# Patient Record
Sex: Male | Born: 1985 | Race: White | Hispanic: No | Marital: Married | Smoking: Current every day smoker
Health system: Southern US, Community
[De-identification: ages and names within clinical notes are randomized; demographics above are authoritative.]

## PROBLEM LIST (undated history)

## (undated) HISTORY — PX: NO PAST SURGERIES: SHX2092

---
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
## 2006-12-05 ENCOUNTER — Emergency Department (HOSPITAL_COMMUNITY): Admission: EM | Admit: 2006-12-05 | Discharge: 2006-12-06 | Payer: Self-pay | Admitting: Emergency Medicine

## 2011-10-10 ENCOUNTER — Emergency Department (HOSPITAL_COMMUNITY)
Admission: EM | Admit: 2011-10-10 | Discharge: 2011-10-10 | Disposition: A | Discharge status: Home / Self Care | Payer: Self-pay | Attending: Emergency Medicine | Admitting: Emergency Medicine

## 2011-10-10 ENCOUNTER — Encounter (HOSPITAL_COMMUNITY): Payer: Self-pay | Admitting: *Deleted

## 2011-10-10 DIAGNOSIS — T07XXXA Unspecified multiple injuries, initial encounter: Secondary | ICD-10-CM

## 2011-10-10 DIAGNOSIS — Z23 Encounter for immunization: Secondary | ICD-10-CM | POA: Insufficient documentation

## 2011-10-10 DIAGNOSIS — IMO0002 Reserved for concepts with insufficient information to code with codable children: Secondary | ICD-10-CM | POA: Insufficient documentation

## 2011-10-10 DIAGNOSIS — F17200 Nicotine dependence, unspecified, uncomplicated: Secondary | ICD-10-CM | POA: Insufficient documentation

## 2011-10-10 DIAGNOSIS — Y92410 Unspecified street and highway as the place of occurrence of the external cause: Secondary | ICD-10-CM | POA: Insufficient documentation

## 2011-10-10 MED ORDER — OXYCODONE-ACETAMINOPHEN 5-325 MG PO TABS
1.0000 | ORAL_TABLET | Freq: Once | ORAL | Status: AC
  Administered 2011-10-10: 1 via ORAL
  Filled 2011-10-10: qty 1

## 2011-10-10 MED ORDER — HYDROCODONE-ACETAMINOPHEN 5-325 MG PO TABS: ORAL_TABLET | ORAL | Status: AC

## 2011-10-10 MED ORDER — IBUPROFEN 800 MG PO TABS: 800.0000 mg | ORAL_TABLET | Freq: Three times a day (TID) | ORAL | Status: AC

## 2011-10-10 MED ORDER — TETANUS-DIPHTH-ACELL PERTUSSIS 5-2.5-18.5 LF-MCG/0.5 IM SUSP
0.5000 mL | Freq: Once | INTRAMUSCULAR | Status: AC
  Administered 2011-10-10: 0.5 mL via INTRAMUSCULAR
  Filled 2011-10-10: qty 0.5

## 2011-10-10 MED ORDER — BACITRACIN 500 UNIT/GM EX OINT
1.0000 "application " | TOPICAL_OINTMENT | Freq: Two times a day (BID) | CUTANEOUS | Status: DC
  Administered 2011-10-10: 1 via TOPICAL
  Filled 2011-10-10 (×2): qty 0.9
  Filled 2011-10-10: qty 3.6
  Filled 2011-10-10 (×2): qty 0.9
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
## 2011-10-10 NOTE — Discharge Instructions (Signed)
Abrasions Abrasions are skin scrapes. Their treatment depends on how large and deep the abrasion is. Abrasions do not extend through all layers of the skin. A cut or lesion through all skin layers is called a laceration. HOME CARE INSTRUCTIONS   If you were given a dressing, change it at least once a day or as instructed by your caregiver. If the bandage sticks, soak it off with a solution of water or hydrogen peroxide.   Twice a day, wash the area with soap and water to remove all the cream/ointment. You may do this in a sink, under a tub faucet, or in a shower. Rinse off the soap and pat dry with a clean towel. Look for signs of infection (see below).   Reapply cream/ointment according to your caregiver's instruction. This will help prevent infection and keep the bandage from sticking. Telfa or gauze over the wound and under the dressing or wrap will also help keep the bandage from sticking.   If the bandage becomes wet, dirty, or develops a foul smell, change it as soon as possible.   Only take over-the-counter or prescription medicines for pain, discomfort, or fever as directed by your caregiver.  SEEK IMMEDIATE MEDICAL CARE IF:   Increasing pain in the wound.   Signs of infection develop: redness, swelling, surrounding area is tender to touch, or pus coming from the wound.   You have a fever.   Any foul smell coming from the wound or dressing.  Most skin wounds heal within ten days. Facial wounds heal faster. However, an infection may occur despite proper treatment. You should have the wound checked for signs of infection within 24 to 48 hours or sooner if problems arise. If you were not given a wound-check appointment, look closely at the wound yourself on the second day for early signs of infection listed above. MAKE SURE YOU:   Understand these instructions.   Will watch your condition.   Will get help right away if you are not doing well or get worse.  Document Released:  02/03/2005 Document Revised: 04/15/2011 Document Reviewed: 03/30/2011 [employer] Patient Information 2012 [employer]. 
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
## 2011-10-18 NOTE — ED Provider Notes (Signed)
Medical screening examination/treatment/procedure(s) were performed by non-physician practitioner and as supervising physician I was immediately available for consultation/collaboration.  Mikia Delaluz R. Kupono Marling, MD 10/18/11 2148 

## 2011-12-30 ENCOUNTER — Encounter (HOSPITAL_COMMUNITY): Payer: Self-pay | Admitting: *Deleted

## 2011-12-30 ENCOUNTER — Emergency Department (HOSPITAL_COMMUNITY)
Admission: EM | Admit: 2011-12-30 | Discharge: 2011-12-30 | Disposition: A | Discharge status: Home / Self Care | Payer: Self-pay | Attending: Emergency Medicine | Admitting: Emergency Medicine

## 2011-12-30 DIAGNOSIS — F17200 Nicotine dependence, unspecified, uncomplicated: Secondary | ICD-10-CM | POA: Insufficient documentation

## 2011-12-30 DIAGNOSIS — T63481A Toxic effect of venom of other arthropod, accidental (unintentional), initial encounter: Secondary | ICD-10-CM | POA: Insufficient documentation

## 2011-12-30 DIAGNOSIS — T6391XA Toxic effect of contact with unspecified venomous animal, accidental (unintentional), initial encounter: Secondary | ICD-10-CM | POA: Insufficient documentation

## 2011-12-30 MED ORDER — PREDNISONE 20 MG PO TABS: ORAL_TABLET | ORAL | Status: DC

## 2011-12-30 MED ORDER — PREDNISONE 20 MG PO TABS
60.0000 mg | ORAL_TABLET | Freq: Once | ORAL | Status: AC
  Administered 2011-12-30: 60 mg via ORAL
  Filled 2011-12-30: qty 3

## 2011-12-30 MED ORDER — FAMOTIDINE 20 MG PO TABS
20.0000 mg | ORAL_TABLET | Freq: Once | ORAL | Status: AC
  Administered 2011-12-30: 20 mg via ORAL
  Filled 2011-12-30: qty 1

## 2011-12-30 MED ORDER — FAMOTIDINE 20 MG PO TABS: 20.0000 mg | ORAL_TABLET | Freq: Two times a day (BID) | ORAL | Status: DC

## 2011-12-30 MED ORDER — DIPHENHYDRAMINE HCL 25 MG PO CAPS
50.0000 mg | ORAL_CAPSULE | Freq: Once | ORAL | Status: AC
  Administered 2011-12-30: 50 mg via ORAL
  Filled 2011-12-30: qty 2

## 2011-12-30 MED ORDER — EPINEPHRINE 0.3 MG/0.3ML IJ DEVI: 0.3000 mg | INTRAMUSCULAR | Status: DC | PRN

## 2011-12-30 NOTE — ED Provider Notes (Signed)
History    This chart was scribed for Christian Human, MD, MD by Smitty Pluck. The patient was seen in room APA14 and the patient's care was started at 9:24AM.   CSN: [id number]  Arrival date & time 12/30/11  7829   First MD Initiated Contact with Patient 12/30/11 [phone]      Chief Complaint  Patient presents with  . Insect Bite    (Consider location/radiation/quality/duration/timing/severity/associated sxs/prior treatment) The history is provided by the patient.   Christian Costa is a 26 y.o. male who presents to the Emergency Department complaining of constant, moderate right facial swelling radiating down to right side of neck due to being stung by yellow jacket 1 day ago. Pt reports that the yellow jacket stung him over his eyebrow. 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 Pt reports taking Benadryl without relief. He reports having swelling before due to bee sting but the swelling was not as bad as current condition. Denies difficulty breathing or swallowing. Pt reports that he smokes 0.5/day.   History reviewed. No pertinent past medical history.  History reviewed. No pertinent past surgical history.  History reviewed. No pertinent family history.  History  Substance Use Topics  . Smoking status: Current Everyday Smoker    Types: Cigarettes  . Smokeless tobacco: Not on file  . Alcohol Use: No      Review of Systems  All other systems reviewed and are negative.  10 Systems reviewed and all are negative for acute change except as noted in the HPI.    Allergies  Review of patient's allergies indicates no known allergies.  Home Medications  No current outpatient prescriptions on file.  BP 125/72  Pulse 84  Temp 98.5 F (36.9 C) (Oral)  Resp 16  Ht 6\' 2"  (1.88 m)  Wt 225 lb (102.059 kg)  BMI 28.89 kg/m2  SpO2 100%  Physical Exam  Nursing note and vitals reviewed. Constitutional: He is oriented to person, place, and time. He appears  well-developed and well-nourished. No distress.  HENT:       Swelling that is puffy of upper and lower eyelid, right cheek and right neck below jaw  No swelling of oropharynx   Eyes: Conjunctivae are normal.  Neck: Normal range of motion. Neck supple.  Pulmonary/Chest: Effort normal and breath sounds normal. No respiratory distress.  Neurological: He is alert and oriented to person, place, and time.  Skin: Skin is warm and dry.       No hives present   Psychiatric: He has a normal mood and affect. His behavior is normal.    ED Course  Procedures (including critical care time) DIAGNOSTIC STUDIES: Oxygen Saturation is 100% on room air, normal by my interpretation.    COORDINATION OF CARE: 9:38AM Ordered:   Medications  predniSONE (DELTASONE) tablet 60 mg (not administered)  diphenhydrAMINE (BENADRYL) capsule 50 mg (not administered)  famotidine (PEPCID) tablet 20 mg (not administered)     DISP: Benadryl 50 mg q6h prn allergic swelling. Pepcid 20 mg bid. Prednisone taper.' EpiPen Rx.     1. Allergic reaction to insect sting     I personally performed the services described in this documentation, which was scribed in my presence. The recorded information has been reviewed and considered.    Medical screening examination/treatment/procedure(s) were conducted as a shared visit with non-physician practitioner(s) and myself.  I personally evaluated the patient during the encounter     Carleene Cooper III, MD 12/30/11 [phone]

## 2011-12-30 NOTE — ED Notes (Signed)
Pt c/o being stung by yellow jacket yesterday on right eyebrow area, pt has facial swelling to right eye that extends dow the side of right face area, pt denies any sob,

## 2011-12-30 NOTE — ED Notes (Signed)
Pt was stung by a yellow jacket on his eyebrow. Right side of face very swollen. Has been taking Benadryl but has not helped. Denies difficulty swallowing or breathing. States that his right side of his neck is started to swell and it hurts to swallow.

## 2012-10-21 ENCOUNTER — Encounter (HOSPITAL_COMMUNITY): Payer: Self-pay | Admitting: *Deleted

## 2012-10-21 ENCOUNTER — Emergency Department (HOSPITAL_COMMUNITY)
Admission: EM | Admit: 2012-10-21 | Discharge: 2012-10-21 | Disposition: A | Discharge status: Home / Self Care | Payer: Self-pay | Attending: Emergency Medicine | Admitting: Emergency Medicine

## 2012-10-21 DIAGNOSIS — R110 Nausea: Secondary | ICD-10-CM | POA: Insufficient documentation

## 2012-10-21 DIAGNOSIS — K0889 Other specified disorders of teeth and supporting structures: Secondary | ICD-10-CM

## 2012-10-21 DIAGNOSIS — K089 Disorder of teeth and supporting structures, unspecified: Secondary | ICD-10-CM | POA: Insufficient documentation

## 2012-10-21 DIAGNOSIS — R51 Headache: Secondary | ICD-10-CM | POA: Insufficient documentation

## 2012-10-21 DIAGNOSIS — F17200 Nicotine dependence, unspecified, uncomplicated: Secondary | ICD-10-CM | POA: Insufficient documentation

## 2012-10-21 MED ORDER — PENICILLIN V POTASSIUM 500 MG PO TABS: 500.0000 mg | ORAL_TABLET | Freq: Four times a day (QID) | ORAL | Status: AC

## 2012-10-21 MED ORDER — OXYCODONE-ACETAMINOPHEN 5-325 MG PO TABS
1.0000 | ORAL_TABLET | Freq: Once | ORAL | Status: AC
  Administered 2012-10-21: 1 via ORAL
  Filled 2012-10-21: qty 1

## 2012-10-21 NOTE — ED Notes (Addendum)
Pt c/o pain to left and right wisdom teeth on the top of his mouth. Pt reports 1 episode of vomiting yesterday.

## 2012-10-21 NOTE — ED Provider Notes (Signed)
History  This chart was scribed for Joya Gaskins, MD by Greggory Stallion, ED Scribe. This patient was seen in room APA03/AP[facility]03 and the patient's care was started at 9:12 AM.  CSN: [id number]  Arrival date & time 10/21/12  0909    No chief complaint on file.  Patient is a 27 y.o. male presenting with tooth pain. The history is provided by the patient. No language interpreter was used.  Dental Pain Location:  Upper Quality:  Aching Severity:  Severe Onset quality:  Gradual Duration:  2 months Timing:  Constant Progression:  Worsening Chronicity:  New Associated symptoms: headaches   Associated symptoms: no fever      PMH - none  No past surgical history on file.  No family history on file.  History  Substance Use Topics  . Smoking status: Current Every Day Smoker    Types: Cigarettes  . Smokeless tobacco: Not on file  . Alcohol Use: No      Review of Systems  Constitutional: Negative for fever.  HENT: Positive for dental problem.   Gastrointestinal: Positive for vomiting.  Neurological: Positive for headaches.    Allergies  Review of patient's allergies indicates no known allergies.  Home Medications   Current Outpatient Rx  Name  Route  Sig  Dispense  Refill  . acetaminophen (TYLENOL) 325 MG tablet   Oral   Take 650 mg by mouth every 6 (six) hours as needed for pain.         Marland Kitchen EPINEPHrine (EPIPEN) 0.3 mg/0.3 mL DEVI   Intramuscular   Inject 0.3 mLs (0.3 mg total) into the muscle as needed (Inject self if bitten by insect, then go to hospital ED immediately.).   1 Device   1     BP 144/94  Pulse 66  Resp 20  SpO2 100%  Physical Exam  CONSTITUTIONAL: Well developed/well nourished HEAD AND FACE: Normocephalic/atraumatic EYES: EOMI/PERRL ENMT: Mucous membranes moist.  Poor dentition.  No trismus.  No focal abscess noted. NECK: supple no meningeal signs CV: S1/S2 noted, no murmurs/rubs/gallops noted LUNGS: Lungs are clear to auscultation  bilaterally, no apparent distress ABDOMEN: soft, nontender, no rebound or guarding NEURO: Pt is awake/alert, moves all extremitiesx4 EXTREMITIES:full ROM SKIN: warm, color normal  ED Course  Procedures   DIAGNOSTIC STUDIES: Oxygen Saturation is 100% on RA, normal by my interpretation.    COORDINATION OF CARE: 9:20 AM-Discussed treatment plan with pt at bedside and pt agreed to plan.     MDM  Nursing notes including past medical history and social history reviewed and considered in documentation       I personally performed the services described in this documentation, which was scribed in my presence. The recorded information has been reviewed and is accurate.     Joya Gaskins, MD 10/21/12 1620

## 2012-10-25 ENCOUNTER — Encounter (HOSPITAL_COMMUNITY): Payer: Self-pay | Admitting: *Deleted

## 2012-10-25 ENCOUNTER — Emergency Department (HOSPITAL_COMMUNITY)
Admission: EM | Admit: 2012-10-25 | Discharge: 2012-10-25 | Disposition: A | Discharge status: Home / Self Care | Payer: Self-pay | Attending: Emergency Medicine | Admitting: Emergency Medicine

## 2012-10-25 DIAGNOSIS — K0889 Other specified disorders of teeth and supporting structures: Secondary | ICD-10-CM

## 2012-10-25 DIAGNOSIS — F17200 Nicotine dependence, unspecified, uncomplicated: Secondary | ICD-10-CM | POA: Insufficient documentation

## 2012-10-25 DIAGNOSIS — K089 Disorder of teeth and supporting structures, unspecified: Secondary | ICD-10-CM | POA: Insufficient documentation

## 2012-10-25 DIAGNOSIS — K029 Dental caries, unspecified: Secondary | ICD-10-CM | POA: Insufficient documentation

## 2012-10-25 MED ORDER — HYDROCODONE-ACETAMINOPHEN 5-325 MG PO TABS: ORAL_TABLET | ORAL | Status: DC

## 2012-10-25 MED ORDER — CLINDAMYCIN HCL 150 MG PO CAPS: ORAL_CAPSULE | ORAL | Status: DC

## 2012-10-25 MED ORDER — NAPROXEN 250 MG PO TABS: 250.0000 mg | ORAL_TABLET | Freq: Two times a day (BID) | ORAL | Status: DC

## 2012-10-25 NOTE — ED Provider Notes (Signed)
History     CSN: [id number]  Arrival date & time 10/25/12  7829   First MD Initiated Contact with Patient 10/25/12 [phone]      Chief Complaint  Patient presents with  . Dental Pain     HPI Pt was seen at 0850.  Per pt, c/o gradual onset and persistence of constant right upper teeth "pain" for the past 2 months, worse over the past several weeks.  Denies fevers, no intra-oral edema, no rash, no facial swelling, no dysphagia, no neck pain.   The condition is aggravated by nothing. The condition is relieved by nothing. The symptoms have been associated with no other complaints. The patient has no significant history of serious medical conditions.     History reviewed. No pertinent past medical history.  History reviewed. No pertinent past surgical history.   History  Substance Use Topics  . Smoking status: Current Every Day Smoker    Types: Cigarettes  . Smokeless tobacco: Not on file  . Alcohol Use: Yes      Review of Systems ROS: Statement: All systems negative except as marked or noted in the HPI; Constitutional: Negative for fever and chills. ; ; Eyes: Negative for eye pain and discharge. ; ; ENMT: Positive for dental caries, dental hygiene poor and toothache. Negative for ear pain, bleeding gums, dental injury, facial deformity, facial swelling, hoarseness, nasal congestion, sinus pressure, sore throat, throat swelling and tongue swollen. ; ; Cardiovascular: Negative for chest pain, palpitations, diaphoresis, dyspnea and peripheral edema. ; ; Respiratory: Negative for cough, wheezing and stridor. ; ; Gastrointestinal: Negative for nausea, vomiting, diarrhea and abdominal pain. ; ; Genitourinary: Negative for dysuria, flank pain and hematuria. ; ; Musculoskeletal: Negative for back pain and neck pain. ; ; Skin: Negative for rash and skin lesion. ; ; Neuro: Negative for headache, lightheadedness and neck stiffness. ;      Allergies  Other  Home Medications   Current  Outpatient Rx  Name  Route  Sig  Dispense  Refill  . clindamycin (CLEOCIN) 150 MG capsule      3 tabs PO TID x 10 days   90 capsule   0   . EPINEPHrine (EPIPEN) 0.3 mg/0.3 mL DEVI   Intramuscular   Inject 0.3 mLs (0.3 mg total) into the muscle as needed (Inject self if bitten by insect, then go to hospital ED immediately.).   1 Device   1   . HYDROcodone-acetaminophen (NORCO/VICODIN) 5-325 MG per tablet      1 or 2 tabs PO q6 hours prn pain   14 tablet   0   . naproxen (NAPROSYN) 250 MG tablet   Oral   Take 1 tablet (250 mg total) by mouth 2 (two) times daily with a meal.   14 tablet   0   . penicillin v potassium (VEETID) 500 MG tablet   Oral   Take 1 tablet (500 mg total) by mouth 4 (four) times daily.   40 tablet   0     BP 148/87  Pulse 73  Resp 19  Ht 6\' 2"  (1.88 m)  Wt 220 lb (99.791 kg)  BMI 28.23 kg/m2  SpO2 94%  Physical Exam 0855: Physical examination: Vital signs and O2 SAT: Reviewed; Constitutional: Well developed, Well nourished, Well hydrated, In no acute distress; Head and Face: Normocephalic, Atraumatic; Eyes: EOMI, PERRL, No scleral icterus; ENMT: Mouth and pharynx normal, Poor dentition, Widespread dental decay, Left TM normal, Right TM normal, Mucous membranes moist, +  upper right 1st molar and 2nd premolar with extensive dental decay.  No gingival erythema, edema, fluctuance, or drainage.  No intra-oral edema. No submandibular or sublingual edema. No hoarse voice, no drooling, no stridor.  ; Neck: Supple, Full range of motion, No lymphadenopathy; Cardiovascular: Regular rate and rhythm, No murmur, rub, or gallop; Respiratory: Breath sounds clear & equal bilaterally, No rales, rhonchi, wheezes, Normal respiratory effort/excursion; Chest: Nontender, Movement normal; Extremities: Pulses normal, No tenderness, No edema; Neuro: AA&Ox3, Major CN grossly intact.  No gross focal motor or sensory deficits in extremities.; Skin: Color normal, No rash, No  petechiae, Warm, Dry   ED Course  Procedures     MDM  MDM Reviewed: previous chart, nursing note and vitals     0855:  Pt encouraged to f/u with dentist or oral surgeon for his dental needs for good continuity of care and definitive treatment.  Verb understanding.         Laray Anger, DO 10/25/12 2123

## 2012-10-25 NOTE — ED Notes (Signed)
Dental pain. 

## 2013-05-19 ENCOUNTER — Emergency Department (HOSPITAL_COMMUNITY)
Admission: EM | Admit: 2013-05-19 | Discharge: 2013-05-19 | Disposition: A | Discharge status: Home / Self Care | Payer: Self-pay | Attending: Emergency Medicine | Admitting: Emergency Medicine

## 2013-05-19 ENCOUNTER — Encounter (HOSPITAL_COMMUNITY): Payer: Self-pay | Admitting: Emergency Medicine

## 2013-05-19 DIAGNOSIS — F17200 Nicotine dependence, unspecified, uncomplicated: Secondary | ICD-10-CM | POA: Insufficient documentation

## 2013-05-19 DIAGNOSIS — K089 Disorder of teeth and supporting structures, unspecified: Secondary | ICD-10-CM | POA: Insufficient documentation

## 2013-05-19 DIAGNOSIS — K0889 Other specified disorders of teeth and supporting structures: Secondary | ICD-10-CM

## 2013-05-19 DIAGNOSIS — Z792 Long term (current) use of antibiotics: Secondary | ICD-10-CM | POA: Insufficient documentation

## 2013-05-19 DIAGNOSIS — K029 Dental caries, unspecified: Secondary | ICD-10-CM | POA: Insufficient documentation

## 2013-05-19 MED ORDER — CLINDAMYCIN HCL 150 MG PO CAPS
150.0000 mg | ORAL_CAPSULE | Freq: Once | ORAL | Status: AC
  Administered 2013-05-19: 150 mg via ORAL
  Filled 2013-05-19: qty 1

## 2013-05-19 MED ORDER — IBUPROFEN 800 MG PO TABS: 800.0000 mg | ORAL_TABLET | Freq: Three times a day (TID) | ORAL | Status: DC

## 2013-05-19 MED ORDER — CLINDAMYCIN HCL 150 MG PO CAPS: 150.0000 mg | ORAL_CAPSULE | Freq: Four times a day (QID) | ORAL | Status: DC

## 2013-05-19 NOTE — Discharge Instructions (Signed)
Dental Care and Dentist Visits Dental care supports good overall health. Regular dental visits can also help you avoid dental pain, bleeding, infection, and other more serious health problems in the future. It is important to keep the mouth healthy because diseases in the teeth, gums, and other oral tissues can spread to other areas of the body. Some problems, such as diabetes, heart disease, and pre-term labor have been associated with poor oral health.  See your dentist every 6 months. If you experience emergency problems such as a toothache or broken tooth, go to the dentist right away. If you see your dentist regularly, you may catch problems early. It is easier to be treated for problems in the early stages.  WHAT TO EXPECT AT A DENTIST VISIT  Your dentist will look for many common oral health problems and recommend proper treatment. At your regular dental visit, you can expect:  Gentle cleaning of the teeth and gums. This includes scraping and polishing. This helps to remove the sticky substance around the teeth and gums (plaque). Plaque forms in the mouth shortly after eating. Over time, plaque hardens on the teeth as tartar. If tartar is not removed regularly, it can cause problems. Cleaning also helps remove stains.  Periodic X-rays. These pictures of the teeth and supporting bone will help your dentist assess the health of your teeth.  Periodic fluoride treatments. Fluoride is a natural mineral shown to help strengthen teeth. Fluoride treatmentinvolves applying a fluoride gel or varnish to the teeth. It is most commonly done in children.  Examination of the mouth, tongue, jaws, teeth, and gums to look for any oral health problems, such as:  Cavities (dental caries). This is decay on the tooth caused by plaque, sugar, and acid in the mouth. It is best to catch a cavity when it is small.  Inflammation of the gums caused by plaque buildup (gingivitis).  Problems with the mouth or malformed  or misaligned teeth.  Oral cancer or other diseases of the soft tissues or jaws. KEEP YOUR TEETH AND GUMS HEALTHY For healthy teeth and gums, follow these general guidelines as well as your dentist's specific advice:  Have your teeth professionally cleaned at the dentist every 6 months.  Brush twice daily with a fluoride toothpaste.  Floss your teeth daily.  Ask your dentist if you need fluoride supplements, treatments, or fluoride toothpaste.  Eat a healthy diet. Reduce foods and drinks with added sugar.  Avoid smoking. TREATMENT FOR ORAL HEALTH PROBLEMS If you have oral health problems, treatment varies depending on the conditions present in your teeth and gums.  Your caregiver will most likely recommend good oral hygiene at each visit.  For cavities, gingivitis, or other oral health disease, your caregiver will perform a procedure to treat the problem. This is typically done at a separate appointment. Sometimes your caregiver will refer you to another dental specialist for specific tooth problems or for surgery. SEEK IMMEDIATE DENTAL CARE IF:  You have pain, bleeding, or soreness in the gum, tooth, jaw, or mouth area.  A permanent tooth becomes loose or separated from the gum socket.  You experience a blow or injury to the mouth or jaw area. Document Released: 01/06/2011 Document Revised: 07/19/2011 Document Reviewed: 01/06/2011 [employer] Patient Information 2014 [employer].  

## 2013-05-19 NOTE — ED Provider Notes (Signed)
CSN: [id number]     Arrival date & time 05/19/13  1110 History   First MD Initiated Contact with Patient 05/19/13 1234     Chief Complaint  Patient presents with  . Dental Pain   (Consider location/radiation/quality/duration/timing/severity/associated sxs/prior Treatment) HPI Comments: Patient is otherwise healthy 28 year old male who presents with continued left upper 2nd molar dental pain - he states that he has been unable to see a dentist because of money issues and having no insurance, he states that he thinks they may be infected again.  He denies fever, chills, inability to open his mouth, nausea, vomiting or diarrhea.  Patient is a 28 y.o. male presenting with tooth pain. The history is provided by the patient. No language interpreter was used.  Dental Pain Location:  Upper Upper teeth location:  15/LU 2nd molar Quality:  Dull and localized Severity:  Moderate Onset quality:  Gradual Duration:  4 days Timing:  Constant Progression:  Worsening Chronicity:  Recurrent Context: dental caries and poor dentition   Context: not abscess, not dental fracture, filling still in place, not intrusion and not malocclusion   Relieved by:  Nothing Worsened by:  Nothing tried Ineffective treatments:  None tried Associated symptoms: gum swelling   Associated symptoms: no congestion, no difficulty swallowing, no drooling, no facial pain, no facial swelling, no fever, no neck pain, no neck swelling, no oral bleeding, no oral lesions and no trismus   Risk factors: periodontal disease     History reviewed. No pertinent past medical history. History reviewed. No pertinent past surgical history. No family history on file. History  Substance Use Topics  . Smoking status: Current Every Day Smoker    Types: Cigarettes  . Smokeless tobacco: Not on file  . Alcohol Use: Yes    Review of Systems  Constitutional: Negative for fever.  HENT: Negative for congestion, drooling, facial swelling and  mouth sores.   Musculoskeletal: Negative for neck pain.  All other systems reviewed and are negative.    Allergies  Other  Home Medications   Current Outpatient Rx  Name  Route  Sig  Dispense  Refill  . ibuprofen (ADVIL,MOTRIN) 200 MG tablet   Oral   Take 1,000 mg by mouth 2 (two) times daily as needed for moderate pain.         Marland Kitchen penicillin v potassium (VEETID) 500 MG tablet   Oral   Take 500 mg by mouth 3 (three) times daily.         . clindamycin (CLEOCIN) 150 MG capsule   Oral   Take 1 capsule (150 mg total) by mouth every 6 (six) hours.   28 capsule   0   . ibuprofen (ADVIL,MOTRIN) 800 MG tablet   Oral   Take 1 tablet (800 mg total) by mouth 3 (three) times daily.   21 tablet   0    BP 148/95  Pulse 57  Temp(Src) 98.7 F (37.1 C) (Axillary)  SpO2 100% Physical Exam  Nursing note and vitals reviewed. Constitutional: He is oriented to person, place, and time. He appears well-developed and well-nourished. No distress.  HENT:  Head: Normocephalic and atraumatic.  Right Ear: External ear normal.  Left Ear: External ear normal.  Nose: Nose normal.  Mouth/Throat: Oropharynx is clear and moist. No trismus in the jaw. Dental caries present. No dental abscesses. No oropharyngeal exudate.    Eyes: Conjunctivae are normal. Pupils are equal, round, and reactive to light.  Neck: Normal range of motion.  Neck supple.  Pulmonary/Chest: Effort normal.  Musculoskeletal: Normal range of motion. He exhibits no edema and no tenderness.  Lymphadenopathy:    He has no cervical adenopathy.  Neurological: He is alert and oriented to person, place, and time. He exhibits normal muscle tone. Coordination normal.  Skin: Skin is warm and dry. No rash noted. No erythema. No pallor.  Psychiatric: He has a normal mood and affect. His behavior is normal. Thought content normal.    ED Course  Procedures (including critical care time) Labs Review Labs Reviewed - No data to  display Imaging Review No results found.  EKG Interpretation   None       MDM   1. Pain, dental    Patient with recurrent dental pain - multiple caries noted, no clinical suspicion for Lud[place]igs angina, PTA, parotitis, mastoiditis.    Izola PriceFrances C. Marisue HumbleSanford, PA-C 05/19/13 1304

## 2013-05-19 NOTE — ED Provider Notes (Signed)
Medical screening examination/treatment/procedure(s) were performed by non-physician practitioner and as supervising physician I was immediately available for consultation/collaboration.  EKG Interpretation   None         Christian Parilla L Jaymir Struble, MD 05/19/13 1607 

## 2013-05-19 NOTE — ED Notes (Signed)
Patient with no complaints at this time. Respirations even and unlabored. Skin warm/dry. Discharge instructions reviewed with patient at this time. Patient given opportunity to voice concerns/ask questions. Patient discharged at this time and left Emergency Department with steady gait.   

## 2013-05-19 NOTE — ED Notes (Signed)
Pt c/o toothache x 4 days.   

## 2014-01-30 ENCOUNTER — Encounter (HOSPITAL_COMMUNITY): Payer: Self-pay | Admitting: Emergency Medicine

## 2014-01-30 ENCOUNTER — Emergency Department (HOSPITAL_COMMUNITY): Payer: Self-pay

## 2014-01-30 ENCOUNTER — Emergency Department (HOSPITAL_COMMUNITY)
Admission: EM | Admit: 2014-01-30 | Discharge: 2014-01-30 | Disposition: A | Discharge status: Home / Self Care | Payer: Self-pay | Attending: Emergency Medicine | Admitting: Emergency Medicine

## 2014-01-30 DIAGNOSIS — F17200 Nicotine dependence, unspecified, uncomplicated: Secondary | ICD-10-CM | POA: Insufficient documentation

## 2014-01-30 DIAGNOSIS — Z791 Long term (current) use of non-steroidal anti-inflammatories (NSAID): Secondary | ICD-10-CM | POA: Insufficient documentation

## 2014-01-30 DIAGNOSIS — Z792 Long term (current) use of antibiotics: Secondary | ICD-10-CM | POA: Insufficient documentation

## 2014-01-30 DIAGNOSIS — R1032 Left lower quadrant pain: Secondary | ICD-10-CM | POA: Insufficient documentation

## 2014-01-30 DIAGNOSIS — K5289 Other specified noninfective gastroenteritis and colitis: Secondary | ICD-10-CM | POA: Insufficient documentation

## 2014-01-30 DIAGNOSIS — K529 Noninfective gastroenteritis and colitis, unspecified: Secondary | ICD-10-CM

## 2014-01-30 LAB — BASIC METABOLIC PANEL
Anion gap: 14 (ref 5–15)
BUN: 6 mg/dL (ref 6–23)
CO2: 25 mEq/L (ref 19–32)
CREATININE: 0.86 mg/dL (ref 0.50–1.35)
Calcium: 9.8 mg/dL (ref 8.4–10.5)
Chloride: 99 mEq/L (ref 96–112)
Glucose, Bld: 106 mg/dL — ABNORMAL HIGH (ref 70–99)
Potassium: 4 mEq/L (ref 3.7–5.3)
Sodium: 138 mEq/L (ref 137–147)

## 2014-01-30 LAB — CBC WITH DIFFERENTIAL/PLATELET
BASOS PCT: 0 % (ref 0–1)
Basophils Absolute: 0 10*3/uL (ref 0.0–0.1)
EOS ABS: 0 10*3/uL (ref 0.0–0.7)
Eosinophils Relative: 0 % (ref 0–5)
HEMATOCRIT: 45.7 % (ref 39.0–52.0)
HEMOGLOBIN: 16 g/dL (ref 13.0–17.0)
Lymphocytes Relative: 6 % — ABNORMAL LOW (ref 12–46)
Lymphs Abs: 0.9 10*3/uL (ref 0.7–4.0)
MCH: 29.5 pg (ref 26.0–34.0)
MCHC: 35 g/dL (ref 30.0–36.0)
MCV: 84.3 fL (ref 78.0–100.0)
MONO ABS: 0.9 10*3/uL (ref 0.1–1.0)
MONOS PCT: 5 % (ref 3–12)
Neutro Abs: 14.5 10*3/uL — ABNORMAL HIGH (ref 1.7–7.7)
Neutrophils Relative %: 89 % — ABNORMAL HIGH (ref 43–77)
Platelets: 202 10*3/uL (ref 150–400)
RBC: 5.42 MIL/uL (ref 4.22–5.81)
RDW: 12.6 % (ref 11.5–15.5)
WBC: 16.2 10*3/uL — ABNORMAL HIGH (ref 4.0–10.5)

## 2014-01-30 LAB — URINALYSIS, ROUTINE W REFLEX MICROSCOPIC
BILIRUBIN URINE: NEGATIVE
GLUCOSE, UA: NEGATIVE mg/dL
Ketones, ur: 15 mg/dL — AB
Leukocytes, UA: NEGATIVE
Nitrite: NEGATIVE
PROTEIN: NEGATIVE mg/dL
UROBILINOGEN UA: 0.2 mg/dL (ref 0.0–1.0)
pH: 6.5 (ref 5.0–8.0)

## 2014-01-30 LAB — URINE MICROSCOPIC-ADD ON

## 2014-01-30 MED ORDER — SODIUM CHLORIDE 0.9 % IV SOLN
Freq: Once | INTRAVENOUS | Status: AC
  Administered 2014-01-30: 10:00:00 via INTRAVENOUS

## 2014-01-30 MED ORDER — DIPHENOXYLATE-ATROPINE 2.5-0.025 MG PO TABS: 2.0000 | ORAL_TABLET | Freq: Four times a day (QID) | ORAL | Status: DC | PRN

## 2014-01-30 MED ORDER — ONDANSETRON HCL 4 MG/2ML IJ SOLN
4.0000 mg | Freq: Once | INTRAMUSCULAR | Status: DC
  Filled 2014-01-30: qty 2

## 2014-01-30 MED ORDER — IOHEXOL 300 MG/ML  SOLN
100.0000 mL | Freq: Once | INTRAMUSCULAR | Status: AC | PRN
  Administered 2014-01-30: 100 mL via INTRAVENOUS

## 2014-01-30 MED ORDER — PROMETHAZINE HCL 25 MG PO TABS: 25.0000 mg | ORAL_TABLET | Freq: Four times a day (QID) | ORAL | Status: DC | PRN

## 2014-01-30 MED ORDER — PROMETHAZINE HCL 25 MG/ML IJ SOLN
12.5000 mg | Freq: Once | INTRAMUSCULAR | Status: AC
  Administered 2014-01-30: 12.5 mg via INTRAVENOUS
  Filled 2014-01-30: qty 1

## 2014-01-30 MED ORDER — ONDANSETRON HCL 4 MG/2ML IJ SOLN
4.0000 mg | Freq: Once | INTRAMUSCULAR | Status: AC
  Administered 2014-01-30: 4 mg via INTRAVENOUS

## 2014-01-30 MED ORDER — SODIUM CHLORIDE 0.9 % IV SOLN
1000.0000 mL | Freq: Once | INTRAVENOUS | Status: AC
  Administered 2014-01-30: 1000 mL via INTRAVENOUS

## 2014-01-30 MED ORDER — HYDROCODONE-ACETAMINOPHEN 5-325 MG PO TABS: 1.0000 | ORAL_TABLET | ORAL | Status: DC | PRN

## 2014-01-30 MED ORDER — SODIUM CHLORIDE 0.9 % IV SOLN: 1000.0000 mL | INTRAVENOUS | Status: DC

## 2014-01-30 NOTE — ED Provider Notes (Signed)
Medical screening examination/treatment/procedure(s) were performed by non-physician practitioner and as supervising physician I was immediately available for consultation/collaboration.   EKG Interpretation None        Bryceton Hantz L Kimila Papaleo, MD 01/30/14 1551 

## 2014-01-30 NOTE — Discharge Instructions (Signed)

## 2014-01-30 NOTE — ED Notes (Signed)
Pt c/o abd pain/n/v/d x 2 days.  

## 2014-01-30 NOTE — ED Provider Notes (Signed)
CSN: [id number]     Arrival date & time 01/30/14  4098 History   First MD Initiated Contact with Patient 01/30/14 1030     Chief Complaint  Patient presents with  . Abdominal Pain     (Consider location/radiation/quality/duration/timing/severity/associated sxs/prior Treatment) Patient is a 28 y.o. male presenting with abdominal pain. The history is provided by the patient. No language interpreter was used.  Abdominal Pain Pain location:  LLQ, RLQ and suprapubic Pain quality: cramping   Pain radiates to:  Does not radiate Pain severity:  Moderate Onset quality:  Gradual Duration:  3 days Timing:  Constant Associated symptoms: diarrhea, nausea and vomiting   Associated symptoms: no chills, no dysuria and no fever   Associated symptoms comment:  He presents with N, V, and bloody diarrhea for the past 3 days. No known fever. No sick contacts. He denies recent travel. He is seeing BRB in his bowel movements since yesterday.    History reviewed. No pertinent past medical history. History reviewed. No pertinent past surgical history. No family history on file. History  Substance Use Topics  . Smoking status: Current Every Day Smoker -- 1.00 packs/day    Types: Cigarettes  . Smokeless tobacco: Not on file  . Alcohol Use: Yes     Comment: occasional    Review of Systems  Constitutional: Negative for fever and chills.  HENT: Negative.   Respiratory: Negative.   Cardiovascular: Negative.   Gastrointestinal: Positive for nausea, vomiting, abdominal pain, diarrhea and blood in stool.  Genitourinary: Negative.  Negative for dysuria.  Musculoskeletal: Positive for myalgias.  Skin: Negative.  Negative for rash.  Neurological: Negative.       Allergies  Other  Home Medications   Prior to Admission medications   Medication Sig Start Date End Date Taking? Authorizing Provider  clindamycin (CLEOCIN) 150 MG capsule Take 1 capsule (150 mg total) by mouth every 6 (six) hours.  05/19/13   Izola Price. Sanford, PA-C  ibuprofen (ADVIL,MOTRIN) 200 MG tablet Take 1,000 mg by mouth 2 (two) times daily as needed for moderate pain.    Historical Provider, MD  ibuprofen (ADVIL,MOTRIN) 800 MG tablet Take 1 tablet (800 mg total) by mouth 3 (three) times daily. 05/19/13   Izola Price. Sanford, PA-C  penicillin v potassium (VEETID) 500 MG tablet Take 500 mg by mouth 3 (three) times daily.    Historical Provider, MD   BP 110/74  Pulse 70  Temp(Src) 97.9 F (36.6 C)  Resp 18  Ht  (1.88 m)  Wt 210 lb (95.255 kg)  BMI 26.95 kg/m2  SpO2 100% Physical Exam  Constitutional: He is oriented to person, place, and time. He appears well-developed and well-nourished.  Neck: Normal range of motion.  Cardiovascular: Normal rate.   Pulmonary/Chest: Effort normal.  Abdominal: Soft. Bowel sounds are normal.  Tender across lower abdomen. No distention, mass or guarding.   Musculoskeletal: Normal range of motion.  Neurological: He is alert and oriented to person, place, and time.  Skin: Skin is warm and dry.  Psychiatric: He has a normal mood and affect.    ED Course  Procedures (including critical care time) Labs Review Labs Reviewed  CBC WITH DIFFERENTIAL - Abnormal; Notable for the following:    WBC 16.2 (*)    Neutrophils Relative % 89 (*)    Neutro Abs 14.5 (*)    Lymphocytes Relative 6 (*)    All other components within normal limits  BASIC METABOLIC PANEL - Abnormal; Notable for  the following:    Glucose, Bld 106 (*)    All other components within normal limits  URINALYSIS, ROUTINE W REFLEX MICROSCOPIC   Results for orders placed during the hospital encounter of 01/30/14  CBC WITH DIFFERENTIAL      Result Value Ref Range   WBC 16.2 (*) 4.0 - 10.5 K/uL   RBC 5.42  4.22 - 5.81 MIL/uL   Hemoglobin 16.0  13.0 - 17.0 g/dL   HCT 16.1  09.6 - 04.5 %   MCV 84.3  78.0 - 100.0 fL   MCH 29.5  26.0 - 34.0 pg   MCHC 35.0  30.0 - 36.0 g/dL   RDW 40.9  81.1 - 91.4 %   Platelets  202  150 - 400 K/uL   Neutrophils Relative % 89 (*) 43 - 77 %   Neutro Abs 14.5 (*) 1.7 - 7.7 K/uL   Lymphocytes Relative 6 (*) 12 - 46 %   Lymphs Abs 0.9  0.7 - 4.0 K/uL   Monocytes Relative 5  3 - 12 %   Monocytes Absolute 0.9  0.1 - 1.0 K/uL   Eosinophils Relative 0  0 - 5 %   Eosinophils Absolute 0.0  0.0 - 0.7 K/uL   Basophils Relative 0  0 - 1 %   Basophils Absolute 0.0  0.0 - 0.1 K/uL  BASIC METABOLIC PANEL      Result Value Ref Range   Sodium 138  137 - 147 mEq/L   Potassium 4.0  3.7 - 5.3 mEq/L   Chloride 99  96 - 112 mEq/L   CO2 25  19 - 32 mEq/L   Glucose, Bld 106 (*) 70 - 99 mg/dL   BUN 6  6 - 23 mg/dL   Creatinine, Ser 7.82  0.50 - 1.35 mg/dL   Calcium 9.8  8.4 - 95.6 mg/dL   GFR calc non Af Amer >90  >90 mL/min   GFR calc Af Amer >90  >90 mL/min   Anion gap 14  5 - 15  URINALYSIS, ROUTINE W REFLEX MICROSCOPIC      Result Value Ref Range   Color, Urine YELLOW  YELLOW   APPearance CLEAR  CLEAR   Specific Gravity, Urine <1.005 (*) 1.005 - 1.030   pH 6.5  5.0 - 8.0   Glucose, UA NEGATIVE  NEGATIVE mg/dL   Hgb urine dipstick SMALL (*) NEGATIVE   Bilirubin Urine NEGATIVE  NEGATIVE   Ketones, ur 15 (*) NEGATIVE mg/dL   Protein, ur NEGATIVE  NEGATIVE mg/dL   Urobilinogen, UA 0.2  0.0 - 1.0 mg/dL   Nitrite NEGATIVE  NEGATIVE   Leukocytes, UA NEGATIVE  NEGATIVE  URINE MICROSCOPIC-ADD ON      Result Value Ref Range   RBC / HPF 3-6  <3 RBC/hpf   Ct Abdomen Pelvis W Contrast  01/30/2014   CLINICAL DATA:  Abdominal pain  EXAM: CT ABDOMEN AND PELVIS WITH CONTRAST  TECHNIQUE: Multidetector CT imaging of the abdomen and pelvis was performed using the standard protocol following bolus administration of intravenous contrast.  CONTRAST:  OMNIPAQUE IOHEXOL 300 MG/ML  SOLN  COMPARISON:  None.  FINDINGS: Lung bases are unremarkable. Sagittal images of the spine are unremarkable. Liver, spleen, pancreas and adrenals are unremarkable.  Kidneys are symmetrical in size and  enhancement. No hydronephrosis or hydroureter.  No aortic aneurysm.  No small bowel obstruction. Small nonspecific lymph nodes are noted in right lower quadrant mesentery. Normal appendix clearly visualized axial image 52.  There is mild thickening of the right colonic wall, transverse colonic wall, descending colonic wall and proximal sigmoid colon wall. Findings are highly suspicious for diffuse colitis. No mesenteric abscess. No pelvic fluid collection. Prostate gland and seminal vesicles are unremarkable. The urinary bladder is unremarkable.  IMPRESSION: 1. There is mild thickening of colonic wall suspicious for diffuse colitis. Clinical correlation is necessary. 2. Normal appendix.  No pericecal inflammation. 3. No hydronephrosis or hydroureter. 4. No small bowel obstruction.   Electronically Signed   By: Natasha Mead M.D.   On: 01/30/2014 12:33   Imaging Review No results found.   EKG Interpretation None      MDM   Final diagnoses:  None    1. Colitis  He is improved with fluids and medications. He does continue to have multiple bowel movements. Discussed use of Lomotil. Will refer to GI for ongoing symptoms and re-evaluation. Return precautions given.     Arnoldo Hooker, PA-C 01/30/14 1329

## 2015-11-12 ENCOUNTER — Emergency Department: Payer: Self-pay

## 2015-11-12 ENCOUNTER — Encounter: Payer: Self-pay | Admitting: *Deleted

## 2015-11-12 ENCOUNTER — Emergency Department
Admission: EM | Admit: 2015-11-12 | Discharge: 2015-11-12 | Disposition: A | Discharge status: Home / Self Care | Payer: Self-pay | Attending: Emergency Medicine | Admitting: Emergency Medicine

## 2015-11-12 DIAGNOSIS — R51 Headache: Secondary | ICD-10-CM | POA: Insufficient documentation

## 2015-11-12 DIAGNOSIS — F17210 Nicotine dependence, cigarettes, uncomplicated: Secondary | ICD-10-CM | POA: Insufficient documentation

## 2015-11-12 DIAGNOSIS — S20211A Contusion of right front wall of thorax, initial encounter: Secondary | ICD-10-CM | POA: Insufficient documentation

## 2015-11-12 DIAGNOSIS — S0083XA Contusion of other part of head, initial encounter: Secondary | ICD-10-CM | POA: Insufficient documentation

## 2015-11-12 DIAGNOSIS — Y929 Unspecified place or not applicable: Secondary | ICD-10-CM | POA: Insufficient documentation

## 2015-11-12 DIAGNOSIS — S02401A Maxillary fracture, unspecified, initial encounter for closed fracture: Secondary | ICD-10-CM | POA: Insufficient documentation

## 2015-11-12 DIAGNOSIS — Y939 Activity, unspecified: Secondary | ICD-10-CM | POA: Insufficient documentation

## 2015-11-12 DIAGNOSIS — Y999 Unspecified external cause status: Secondary | ICD-10-CM | POA: Insufficient documentation

## 2015-11-12 MED ORDER — OXYCODONE-ACETAMINOPHEN 5-325 MG PO TABS
2.0000 | ORAL_TABLET | Freq: Once | ORAL | Status: AC
  Administered 2015-11-12: 2 via ORAL
  Filled 2015-11-12: qty 2

## 2015-11-12 MED ORDER — IBUPROFEN 800 MG PO TABS: 800.0000 mg | ORAL_TABLET | Freq: Three times a day (TID) | ORAL | Status: DC | PRN

## 2015-11-12 MED ORDER — OXYCODONE-ACETAMINOPHEN 5-325 MG PO TABS: 2.0000 | ORAL_TABLET | Freq: Four times a day (QID) | ORAL | Status: DC | PRN
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
## 2015-11-12 NOTE — ED Notes (Signed)
MD at bedside. 
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
## 2018-06-07 ENCOUNTER — Encounter: Payer: Self-pay | Admitting: Emergency Medicine

## 2018-06-07 ENCOUNTER — Ambulatory Visit
Admission: EM | Admit: 2018-06-07 | Discharge: 2018-06-07 | Disposition: A | Discharge status: Home / Self Care | Payer: Self-pay | Attending: Family Medicine | Admitting: Family Medicine

## 2018-06-07 ENCOUNTER — Other Ambulatory Visit: Payer: Self-pay

## 2018-06-07 ENCOUNTER — Ambulatory Visit (INDEPENDENT_AMBULATORY_CARE_PROVIDER_SITE_OTHER): Payer: Self-pay

## 2018-06-07 DIAGNOSIS — L03011 Cellulitis of right finger: Secondary | ICD-10-CM

## 2018-06-07 MED ORDER — TRAMADOL HCL 50 MG PO TABS: 50.0000 mg | ORAL_TABLET | Freq: Three times a day (TID) | ORAL | 0 refills | Status: DC | PRN

## 2018-06-07 MED ORDER — DOXYCYCLINE HYCLATE 100 MG PO CAPS: 100.0000 mg | ORAL_CAPSULE | Freq: Two times a day (BID) | ORAL | 0 refills | Status: DC

## 2018-06-07 NOTE — Discharge Instructions (Signed)
Rest, elevate.  Medication as prescribed.  If you worsen or fail to improve, please go to the hospital.  Take care  Dr. Adriana Simas

## 2018-06-07 NOTE — ED Triage Notes (Signed)
Pt c/o right pinky finger pain and swelling. Started 3 days ago. No known injury.

## 2018-06-07 NOTE — ED Provider Notes (Addendum)
[facility]    CSN: [id number] Arrival date & time: 06/07/18  1327  History   Chief Complaint Chief Complaint  Patient presents with  . Hand Pain    right pinky finger   HPI  33 year old male presents with the above complaint.  Reports pain, swelling, and redness of his right fifth digit.  This started approximately 3 days ago.  Patient does not recall any fall, trauma, injury.  He has had some scratches to this finger.  This is presumably due to his job as he works in [employer].  His finger is exquisitely tender. Red. No documented fever. Pain with ROM.  No other associated symptoms. No other complaints.   Social Hx reviewed as below. Social History Social History   Tobacco Use  . Smoking status: Current Every Day Smoker    Packs/day: 1.00    Types: Cigarettes  . Smokeless tobacco: Never Used  Substance Use Topics  . Alcohol use: Yes    Comment: occasional  . Drug use: Not Currently    Types: Marijuana    Comment: former   Allergies   Other  Review of Systems Review of Systems  Constitutional: Negative.   Musculoskeletal:       Right 5th digit pain, swelling, redness.    Physical Exam Triage Vital Signs ED Triage Vitals  Enc Vitals Group     BP 06/07/18 1348 128/82     Pulse Rate 06/07/18 1348 78     Resp 06/07/18 1348 18     Temp 06/07/18 1348 97.6 F (36.4 C)     Temp Source 06/07/18 1348 Oral     SpO2 06/07/18 1348 97 %     Weight 06/07/18 1344 220 lb (99.8 kg)     Height 06/07/18 1344 6\' 2"  (1.88 m)     Head Circumference --      Peak Flow --      Pain Score --      Pain Loc --      Pain Edu? --      Excl. in GC? --    Updated Vital Signs BP 128/82 (BP Location: Left Arm)   Pulse 78   Temp 97.6 F (36.4 C) (Oral)   Resp 18   Ht 6\' 2"  (1.88 m)   Wt 99.8 kg   SpO2 97%   BMI 28.25 kg/m   Visual Acuity Right Eye Distance:   Left Eye Distance:   Bilateral Distance:    Right Eye Near:   Left Eye Near:    Bilateral  Near:     Physical Exam Vitals signs and nursing note reviewed.  Constitutional:      General: He is not in acute distress. HENT:     Head: Normocephalic and atraumatic.     Nose: Nose normal.  Eyes:     General: No scleral icterus.    Conjunctiva/sclera: Conjunctivae normal.  Pulmonary:     Effort: Pulmonary effort is normal.     Breath sounds: Normal breath sounds.  Musculoskeletal:     Comments: Right 5th digit -diffuse swelling, erythema.  Warm to the touch.  Patient with decreased range of motion secondary to pain.  No appreciable felon on exam.  Skin:    General: Skin is warm.     Findings: No bruising.  Neurological:     Mental Status: He is alert.  Psychiatric:        Mood and Affect: Mood normal.        Behavior:  Behavior normal.    UC Treatments / Results  Labs (all labs ordered are listed, but only abnormal results are displayed) Labs Reviewed - No data to display  EKG None  Radiology Dg Finger Little Right  Result Date: 06/07/2018 CLINICAL DATA:  Fifth digit pain and swelling for several days, no known injury, initial encounter EXAM: RIGHT LITTLE FINGER 3V COMPARISON:  None. FINDINGS: Soft tissue swelling is seen. No acute fracture or dislocation is noted. No radiopaque foreign body is seen. IMPRESSION: Soft tissue swelling without acute bony abnormality. Electronically Signed   By: Alcide CleverMark  Lukens M.D.   On: 06/07/2018 14:37    Procedures Procedures (including critical care time)  Medications Ordered in UC Medications - No data to display  Initial Impression / Assessment and Plan / UC Course  I have reviewed the triage vital signs and the nursing notes.  Pertinent labs & imaging results that were available during my care of the patient were reviewed by me and considered in my medical decision making (see chart for details).    33 year old male presents with cellulitis of the right fifth digit.  Treated with doxycycline.  Advised to go to the hospital if  he fails to improve or worsens. Tramadol for pain.  Final Clinical Impressions(s) / UC Diagnoses   Final diagnoses:  Cellulitis of finger of right hand     Discharge Instructions     Rest, elevate.  Medication as prescribed.  If you worsen or fail to improve, please go to the hospital.  Take care  Dr. Adriana Simasook     ED Prescriptions    Medication Sig Dispense Auth. Provider   doxycycline (VIBRAMYCIN) 100 MG capsule  (Status: Discontinued) Take 1 capsule (100 mg total) by mouth 2 (two) times daily. 20 capsule [place], Daritza Brees G, DO   doxycycline (VIBRAMYCIN) 100 MG capsule Take 1 capsule (100 mg total) by mouth 2 (two) times daily. 14 capsule Shloimy Michalski G, DO   traMADol (ULTRAM) 50 MG tablet Take 1 tablet (50 mg total) by mouth every 8 (eight) hours as needed. 10 tablet Tommie Samsook, Keevin Panebianco G, DO     Controlled Substance Prescriptions [place] Controlled Substance Registry consulted? Not Applicable   Tommie SamsCook, Deno Sida G, DO 06/07/18 1502    Everlene Otherook, Annaclaire Walsworth G, DO 06/07/18 1502

## 2018-06-19 ENCOUNTER — Emergency Department
Admission: EM | Admit: 2018-06-19 | Discharge: 2018-06-19 | Discharge status: Against Medical Advice | Payer: Self-pay | Attending: Emergency Medicine | Admitting: Emergency Medicine

## 2018-06-19 ENCOUNTER — Emergency Department: Payer: Self-pay

## 2018-06-19 ENCOUNTER — Other Ambulatory Visit: Payer: Self-pay

## 2018-06-19 DIAGNOSIS — S60456A Superficial foreign body of right little finger, initial encounter: Secondary | ICD-10-CM | POA: Insufficient documentation

## 2018-06-19 DIAGNOSIS — M795 Residual foreign body in soft tissue: Secondary | ICD-10-CM

## 2018-06-19 DIAGNOSIS — W458XXA Other foreign body or object entering through skin, initial encounter: Secondary | ICD-10-CM | POA: Insufficient documentation

## 2018-06-19 DIAGNOSIS — M79641 Pain in right hand: Secondary | ICD-10-CM | POA: Insufficient documentation

## 2018-06-19 DIAGNOSIS — Y9389 Activity, other specified: Secondary | ICD-10-CM | POA: Insufficient documentation

## 2018-06-19 DIAGNOSIS — Y998 Other external cause status: Secondary | ICD-10-CM | POA: Insufficient documentation

## 2018-06-19 DIAGNOSIS — F17210 Nicotine dependence, cigarettes, uncomplicated: Secondary | ICD-10-CM | POA: Insufficient documentation

## 2018-06-19 DIAGNOSIS — Y9289 Other specified places as the place of occurrence of the external cause: Secondary | ICD-10-CM | POA: Insufficient documentation

## 2018-06-19 MED ORDER — CLINDAMYCIN HCL 300 MG PO CAPS: 300.0000 mg | ORAL_CAPSULE | Freq: Four times a day (QID) | ORAL | 0 refills | Status: AC

## 2018-06-19 MED ORDER — CLINDAMYCIN PHOSPHATE 600 MG/4ML IJ SOLN
600.0000 mg | Freq: Once | INTRAMUSCULAR | Status: AC
  Administered 2018-06-19: 600 mg via INTRAMUSCULAR
  Filled 2018-06-19: qty 4

## 2018-06-19 NOTE — ED Notes (Signed)
Pt up to desk stating he is leaving, provider notified and is in speaking with patient at this time.

## 2018-06-19 NOTE — ED Notes (Signed)
Patient transported to US 

## 2018-06-19 NOTE — ED Triage Notes (Signed)
Pt with wooden splinter to right 5th finger today, pt was already being treated for cellulitis to that same finger. Swelling and redness noted.

## 2018-06-19 NOTE — ED Provider Notes (Signed)
[facility] Emergency Department Provider Note  ____________________________________________  Time seen: Approximately 8:19 PM  I have reviewed the triage vital signs and the nursing notes.   HISTORY  Chief Complaint Finger Injury    HPI Christian Costa is a 33 y.o. male that presents emergency department for evaluation of splinter to right little finger this afternoon.  Patient states that he was moving a board when a splinter went into his right little finger.  He was seen at urgent care 1.5 weeks ago and was given a prescription for doxycycline for cellulitis.  He finished prescription on Friday.  He states the finger seemed to improve on doxycycline but seemed to worsen again today after getting injured with the splinter.  Patient states that when he went to urgent care 1.5 weeks ago, finger was extremely swollen.  Last week skin around finger started to peel off.  He states that the "new skin is growing now."  He does not have any pain to straighten his finger.  He does not have any pain over the pad of his finger extending down through his finger.  He has only has pain directly to the site where the splinter was over the first joint.  He thinks he got the splinter out but is not sure.  No IV drug use.  No fevers.  No past medical history on file.  There are no active problems to display for this patient.   Past Surgical History:  Procedure Laterality Date  . NO PAST SURGERIES      Prior to Admission medications   Medication Sig Start Date End Date Taking? Authorizing Provider  clindamycin (CLEOCIN) 300 MG capsule Take 1 capsule (300 mg total) by mouth 4 (four) times daily for 10 days. 06/19/18 06/29/18  Enid DerryWagner, Ameira Alessandrini, PA-C  doxycycline (VIBRAMYCIN) 100 MG capsule Take 1 capsule (100 mg total) by mouth 2 (two) times daily. 06/07/18   Tommie Samsook, Jayce G, DO  traMADol (ULTRAM) 50 MG tablet Take 1 tablet (50 mg total) by mouth every 8 (eight) hours as needed. 06/07/18    Tommie Samsook, Jayce G, DO    Allergies Other  Family History  Problem Relation Age of Onset  . Healthy Mother   . Healthy Father     Social History Social History   Tobacco Use  . Smoking status: Current Every Day Smoker    Packs/day: 1.00    Types: Cigarettes  . Smokeless tobacco: Never Used  Substance Use Topics  . Alcohol use: Yes    Comment: occasional  . Drug use: Not Currently    Types: Marijuana    Comment: former     Review of Systems  Constitutional: No fever/chills Cardiovascular: No chest pain. Respiratory: No SOB. Gastrointestinal: No nausea, no vomiting.  Musculoskeletal: Positive for finger pain. Skin: Negative for rash, abrasions, lacerations, ecchymosis. Neurological: Negative for headaches, numbness or tingling   ____________________________________________   PHYSICAL EXAM:  VITAL SIGNS: ED Triage Vitals  Enc Vitals Group     BP 06/19/18 1905 (!) 145/130     Pulse Rate 06/19/18 1905 93     Resp 06/19/18 1905 20     Temp 06/19/18 1905 98.7 F (37.1 C)     Temp Source 06/19/18 1905 Oral     SpO2 06/19/18 1905 97 %     Weight 06/19/18 1906 226 lb (102.5 kg)     Height --      Head Circumference --      Peak Flow --  Pain Score 06/19/18 1906 10     Pain Loc --      Pain Edu? --      Excl. in GC? --      Constitutional: Alert and oriented. Well appearing and in no acute distress. Eyes: Conjunctivae are normal. PERRL. EOMI. Head: Atraumatic. ENT:      Ears:      Nose: No congestion/rhinnorhea.      Mouth/Throat: Mucous membranes are moist.  Neck: No stridor. Cardiovascular: Normal rate, regular rhythm.  Good peripheral circulation. Respiratory: Normal respiratory effort without tachypnea or retractions. Lungs CTAB. Good air entry to the bases with no decreased or absent breath sounds. Musculoskeletal: Full range of motion to all extremities. No gross deformities appreciated. Neurologic:  Normal speech and language. No gross focal  neurologic deficits are appreciated.  Skin:  Skin is warm, dry.  Laceration and puncture to PIP joint of volar right little finger.  Epidermis over little finger has peeled off with new raw skin underneath.  Minimal fuisiform swelling to finger.  No tenderness to palpation over flexor tendon.  No pain with extension of little finger.  Patient states that pain is relieved with extension of finger.  Finger is not held in passive flexion. Psychiatric: Mood and affect are normal. Speech and behavior are normal. Patient exhibits appropriate insight and judgement.   ____________________________________________   LABS (all labs ordered are listed, but only abnormal results are displayed)  Labs Reviewed - No data to display ____________________________________________  EKG   ____________________________________________  RADIOLOGY Lexine Baton, personally viewed and evaluated these images (plain radiographs) as part of my medical decision making, as well as reviewing the written report by the radiologist.  Dg Finger Little Right  Result Date: 06/19/2018 CLINICAL DATA:  Wooden splinter in right little finger. EXAM: RIGHT LITTLE FINGER 2+V COMPARISON:  None. FINDINGS: No acute bony abnormality. Specifically, no fracture, subluxation, or dislocation. No radiopaque foreign body. Joint spaces maintained. IMPRESSION: No acute bony abnormality or radiopaque foreign body. Electronically Signed   By: Charlett Nose M.D.   On: 06/19/2018 19:22   [place] Rt Upper Extrem Ltd Soft Tissue Non Vascular  Result Date: 06/19/2018 CLINICAL DATA:  The patient got a splinter in his right little finger today. EXAM: ULTRASOUND RIGHT UPPER EXTREMITY LIMITED TECHNIQUE: Ultrasound examination of the upper extremity soft tissues was performed in the area of clinical concern. COMPARISON:  None. FINDINGS: No foreign body is seen.  No fluid collection is seen. IMPRESSION: Negative for foreign body. Electronically Signed   By:  Drusilla Kanner M.D.   On: 06/19/2018 21:42    ____________________________________________    PROCEDURES  Procedure(s) performed:    Procedures    Medications  clindamycin (CLEOCIN) injection 600 mg (600 mg Intramuscular Given 06/19/18 2021)     ____________________________________________   INITIAL IMPRESSION / ASSESSMENT AND PLAN / ED COURSE  Pertinent labs & imaging results that were available during my care of the patient were reviewed by me and considered in my medical decision making (see chart for details).  Review of the [place] CSRS was performed in accordance of the [facility] prior to dispensing any controlled drugs.   Patient's diagnosis is consistent with wound and finger infection.  Symptoms and clinical picture are not consistent with flexor tenosynovitis at this time but concerning that this could develop into a flexor tenosynovitis.  Patient was given IM clindamycin.  X-ray negative for acute abnormalities.  Ultrasound was ordered to evaluate for foreign body and or fluid.  Patient does not want to wait for results or any MD consultation and chooses to sign out AMA.  He is agreeable to return to the emergency department tomorrow if symptoms worsen.  Patient will be discharged home with prescriptions for clindamycin.  Patient is to follow up with hand surgery as directed. Patient is given ED precautions to return to the ED for any worsening or new symptoms.     ____________________________________________  FINAL CLINICAL IMPRESSION(S) / ED DIAGNOSES  Final diagnoses:  Foreign body (FB) in soft tissue      NEW MEDICATIONS STARTED DURING THIS VISIT:  ED Discharge Orders         Ordered    clindamycin (CLEOCIN) 300 MG capsule  4 times daily     06/19/18 2144              This chart was dictated using voice recognition software/Dragon. Despite best efforts to proofread, errors can occur which can change the meaning. Any change was purely  unintentional.    Enid DerryWagner, Aleksey [place]bern, PA-C 06/19/18 2339    Nita SickleVeronese, [place], MD 06/25/18 [phone]

## 2018-06-19 NOTE — ED Notes (Signed)
Pt in xray

## 2018-06-19 NOTE — Discharge Instructions (Signed)
Please return to the emergency department immediately for fever, increasing redness or pain to finger.

## 2019-02-13 ENCOUNTER — Emergency Department (HOSPITAL_COMMUNITY)
Admission: EM | Admit: 2019-02-13 | Discharge: 2019-02-13 | Disposition: A | Discharge status: Home / Self Care | Payer: Medicaid - Out of State | Attending: Emergency Medicine | Admitting: Emergency Medicine

## 2019-02-13 ENCOUNTER — Encounter (HOSPITAL_COMMUNITY): Payer: Self-pay | Admitting: *Deleted

## 2019-02-13 ENCOUNTER — Other Ambulatory Visit: Payer: Self-pay

## 2019-02-13 ENCOUNTER — Emergency Department (HOSPITAL_COMMUNITY): Payer: Medicaid - Out of State

## 2019-02-13 DIAGNOSIS — F22 Delusional disorders: Secondary | ICD-10-CM | POA: Insufficient documentation

## 2019-02-13 DIAGNOSIS — R0789 Other chest pain: Secondary | ICD-10-CM | POA: Diagnosis present

## 2019-02-13 DIAGNOSIS — F15129 Other stimulant abuse with intoxication, unspecified: Secondary | ICD-10-CM | POA: Insufficient documentation

## 2019-02-13 DIAGNOSIS — F17210 Nicotine dependence, cigarettes, uncomplicated: Secondary | ICD-10-CM | POA: Diagnosis not present

## 2019-02-13 DIAGNOSIS — F1910 Other psychoactive substance abuse, uncomplicated: Secondary | ICD-10-CM

## 2019-02-13 LAB — COMPREHENSIVE METABOLIC PANEL
ALT: 25 U/L (ref 0–44)
AST: 21 U/L (ref 15–41)
Albumin: 4.5 g/dL (ref 3.5–5.0)
Alkaline Phosphatase: 84 U/L (ref 38–126)
Anion gap: 9 (ref 5–15)
BUN: 8 mg/dL (ref 6–20)
CO2: 28 mmol/L (ref 22–32)
Calcium: 9.8 mg/dL (ref 8.9–10.3)
Chloride: 101 mmol/L (ref 98–111)
Creatinine, Ser: 0.95 mg/dL (ref 0.61–1.24)
GFR calc Af Amer: >60 mL/min (ref >60)
GFR calc non Af Amer: >60 mL/min (ref >60)
Glucose, Bld: 123 mg/dL — ABNORMAL HIGH (ref 70–99)
Potassium: 3.6 mmol/L (ref 3.5–5.1)
Sodium: 138 mmol/L (ref 135–145)
Total Bilirubin: 0.2 mg/dL — ABNORMAL LOW (ref 0.3–1.2)
Total Protein: 7.7 g/dL (ref 6.5–8.1)

## 2019-02-13 LAB — RAPID URINE DRUG SCREEN, HOSP PERFORMED
Amphetamines: POSITIVE — AB
Barbiturates: NOT DETECTED
Benzodiazepines: NOT DETECTED
Cocaine: NOT DETECTED
Opiates: NOT DETECTED
Tetrahydrocannabinol: NOT DETECTED

## 2019-02-13 MED ORDER — SODIUM CHLORIDE 0.9% FLUSH: 3.0000 mL | Freq: Once | INTRAVENOUS | Status: DC

## 2019-02-13 NOTE — ED Triage Notes (Signed)
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

## 2019-02-13 NOTE — Discharge Instructions (Addendum)
Please stop using drugs, this causes most of your problems  Substance Abuse Treatment Programs  Intensive Outpatient Programs [place]     [address], [place]       [facility] [place] [address] [place], [place]  [facility]     (Inpatient and outpatient)     [address]           [place] [phone] (Suboxone and Methadone)  [place], [place] [address]      [place] [address] [place], [place]  [facility] [place] [facility] (Outpatient/Inpatient, [place])    (insurance only) [phone]             [facility] ([place]) [place], [place]     [facility]     [address]     [place], [place]       [facility] (for caregivers and family) [phone] [address] Kristeen Mans. [place], [place]      Residential Treatment Programs [facility]      [address] [place], [place] [address]  [phone]       [facility] [address], [place], [place] [address] [phone]  [facility] [place]        [phone]       [facility] [phone]  [facility] ([place]             [place], [place] or [place] of [place], [address] [phone]  [facility] [place]    [address]      [place], [place]       [facility] [address] [place], [place]  [place]   [address] [place], [place] [address]     [phone]      Admissions: 8am-3pm M-F  Residential  Treatment Services (RTS) [address] [place], [place]  [facility] Program: Residential Program [phone] Days)   [place], [place] or [phone]     ADATC: [facility] [place], [place] (Walk in Hours over the weekend or by referral)  [facility] [place], [place], [place] [address] [phone]  Crisis Mobile: [facility]:  [phone] (for crisis response 24 hours a day) [place]:  [phone] Outpatient Psychiatry and Counseling  [facility]: Mobile Crisis Management 24 hours:  [phone]  [facility] of the [employer] sliding scale fee and walk in schedule: M-F 8am-12pm/1pm-3pm [place], [place] [address] [place], [place] [address] [phone]  [facility] (Formerly known as [facility] [employer])- new patient walk-in appointments available Monday - Friday 8am -3pm.          [address] [place], [place] [address] [phone] or crisis line- [place] [facility]/ Intensive Outpatient Therapy Program [place], [place] [address] [place]      [phone] [address], [place] [address]                 [place]   [facility] [phone]. [place], [place] [address]   [employer] of Care          [address] Johnette Abraham  [place], [place] [address]       [phone]  [facility] [address] [place], [place] [address] [phone]  [facility]    [address] [place], [place] [address]     [place], [place] Joycelyn Man     [place] [address]     [phone]        [facility] [place] [address]  [facility]     [address], [place], [place], [place] [address] [place]     [address]     [place], [place] [address]     [phone]       [facility] [address] [place], [place] [address] [phone] Resources for Temporary Residential Assistance/Crisis [facility] [place]) M-F 8am-3pm   [address] [place], [place] [address]   [phone] Services include: laundry, barbering, support groups, case management, phone  & computer access, showers, AA/NA mtgs, mental health/substance abuse nurse, job skills class, disability information, VA assistance, spiritual classes, etc.   [facility]   [address], [place]     [place]              [employer] (women and children)       [address]. [place], [place] [address] [phone] [email or web address] for application and process Application Required  [facility]   [address]    [place] [address]     [phone]                    [place],  [address] [phone] [id number] application appt.) Application Required  [employer] (women only)    [place], [place]  [place]      Intake starts 6pm daily Need valid ID, SSC, & Police report [employer] [address] [place], [place] [phone] Application Required  [employer] (men only)     [place].      [place], [place]       [place] (Pregnant women only) [address] [place], [place]  [facility]      [place] [address]i [address].      [place], [place] [address]     [phone]             [facility] [address] [place], [place] 90 day commitment/SA/Application process  [facility](men only)     [address]     [place], [place]       Check-in at [facility] of [facility] [address] [place], [place] [address] [phone] Men/Women/Women and Children must be there by 7 pm  [place], [place]

## 2019-02-13 NOTE — ED Provider Notes (Signed)
[facility] EMERGENCY DEPARTMENT Provider Note   CSN: [id number] Arrival date & time: 02/13/19  1916     History   Chief Complaint Chief Complaint  Patient presents with  . Chest Pain    HPI Christian Costa is a 33 y.o. male.     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  He does not use any other drugs but unfortunately uses this from time to time and when he does he becomes paranoid.  Today he was feeling more paranoid when he was at the park and felt like the same cars were following him around and around.  He recognized that they had different license plates but still felt like it was the same cars chasing him down.  He does not want to hurt anybody does not want hurt himself and does not feel like his life is in danger.  He denies any auditory hallucinations, denies alcohol intake, marijuana use, heroin use opiate use or benzodiazepine use.  He denies chest pain or shortness of breath at this time and states the only thing that bothers him is his drug use which makes him very paranoid.  He denies any ongoing or formal treatment for mental health disorders has never been hospitalized in a mental health hospital and does not take any daily medications at all.  History reviewed. No pertinent past medical history.  There are no active problems to display for this patient.   Past Surgical History:  Procedure Laterality Date  . NO PAST SURGERIES          Home Medications    Prior to Admission medications   Not on File    Family History Family History  Problem Relation Age of Onset  . Healthy Mother   . Healthy Father     Social History Social History   Tobacco Use  . Smoking status: Current Every Day Smoker    Packs/day: 1.00    Types: Cigarettes  .  Smokeless tobacco: Never Used  Substance Use Topics  . Alcohol use: Yes    Comment: occasional  . Drug use: Yes    Types: Marijuana, Methamphetamines    Comment: former     Allergies   Other   Review of Systems Review of Systems  All other systems reviewed and are negative.    Physical Exam Updated Vital Signs BP (!) 138/96   Pulse 99   Temp 98.5 F (36.9 C) (Oral)   Resp 17   Ht 1.88 m (6\' 2" )   Wt 99.8 kg   SpO2 98%   BMI 28.25 kg/m   Physical Exam Vitals signs and nursing note reviewed.  Constitutional:      General: He is not in acute distress.    Appearance: He is well-developed.  HENT:     Head: Normocephalic and atraumatic.     Mouth/Throat:     Pharynx: No oropharyngeal exudate.  Eyes:     General: No scleral icterus.       Right eye: No discharge.        Left eye: No discharge.     Conjunctiva/sclera: Conjunctivae normal.     Pupils: Pupils are equal, round, and reactive to light.  Neck:     Musculoskeletal: Normal range of motion and neck supple.  Thyroid: No thyromegaly.     Vascular: No JVD.  Cardiovascular:     Rate and Rhythm: Normal rate and regular rhythm.     Heart sounds: Normal heart sounds. No murmur. No friction rub. No gallop.   Pulmonary:     Effort: Pulmonary effort is normal. No respiratory distress.     Breath sounds: Normal breath sounds. No wheezing or rales.  Abdominal:     General: Bowel sounds are normal. There is no distension.     Palpations: Abdomen is soft. There is no mass.     Tenderness: There is no abdominal tenderness.  Musculoskeletal: Normal range of motion.        General: No tenderness.  Lymphadenopathy:     Cervical: No cervical adenopathy.  Skin:    General: Skin is warm and dry.     Findings: No erythema or rash.  Neurological:     Mental Status: He is alert.     Coordination: Coordination normal.  Psychiatric:        Behavior: Behavior normal.     Comments: The patient is fidgety, he appears  to be glancing around the room, he denies active paranoia or hallucinations.  He denies suicidality or homicidality      ED Treatments / Results  Labs (all labs ordered are listed, but only abnormal results are displayed) Labs Reviewed  COMPREHENSIVE METABOLIC PANEL - Abnormal; Notable for the following components:      Result Value   Glucose, Bld 123 (*)    Total Bilirubin 0.2 (*)    All other components within normal limits  RAPID URINE DRUG SCREEN, HOSP PERFORMED - Abnormal; Notable for the following components:   Amphetamines POSITIVE (*)    All other components within normal limits    EKG EKG Interpretation  Date/Time:  Tuesday February 13 2019 19:33:18 EDT Ventricular Rate:  115 PR Interval:  114 QRS Duration: 86 QT Interval:  314 QTC Calculation: 434 R Axis:   97 Text Interpretation:  Sinus tachycardia Rightward axis Borderline ECG Since last tracing rate faster Confirmed by Eber HongMiller, Tyne Banta ([id number]) on 02/13/2019 7:40:10 PM   Radiology Dg Chest 2 View  Result Date: 02/13/2019 CLINICAL DATA:  Chest pain EXAM: CHEST - 2 VIEW COMPARISON:  11/12/2015 FINDINGS: The heart size and mediastinal contours are within normal limits. Both lungs are clear. The visualized skeletal structures are unremarkable. IMPRESSION: No active cardiopulmonary disease. Electronically Signed   By: Jasmine PangKim  Fujinaga M.D.   On: 02/13/2019 20:54    Procedures Procedures (including critical care time)  Medications Ordered in ED Medications  sodium chloride flush (NS) 0.9 % injection 3 mL (3 mLs Intravenous Not Given 02/13/19 2133)     Initial Impression / Assessment and Plan / ED Course  I have reviewed the triage vital signs and the nursing notes.  Pertinent labs & imaging results that were available during my care of the patient were reviewed by me and considered in my medical decision making (see chart for details).        We will consult with psychiatry though I think the patient will likely  be stable to follow-up in the outpatient setting for drug abuse and paranoia.  He does not appear to be a danger to himself or others and does not need to be involuntarily committed  The patient does not want to wait to be seen by psychiatry, I think he is stable for discharge from a safety standpoint, I have encouraged the patient not to  use drugs and counseled him on the problems that come from drug abuse.  Final Clinical Impressions(s) / ED Diagnoses   Final diagnoses:  Drug abuse (HCC)  Paranoia [facility])    ED Discharge Orders    None       Eber Hong, MD 02/13/19 2241

## 2019-04-12 IMAGING — CR DG FINGER LITTLE 2+V*R*
1 series · 3 of 3 positions shown · non-contrast
Comparison: None.

CLINICAL DATA: Regino Liberato in right little finger.

EXAM:
RIGHT LITTLE FINGER 2+V

[Series 1: dg finger little right · 0.14mm/px · 3 of 3 slices shown]
[im 1/3]
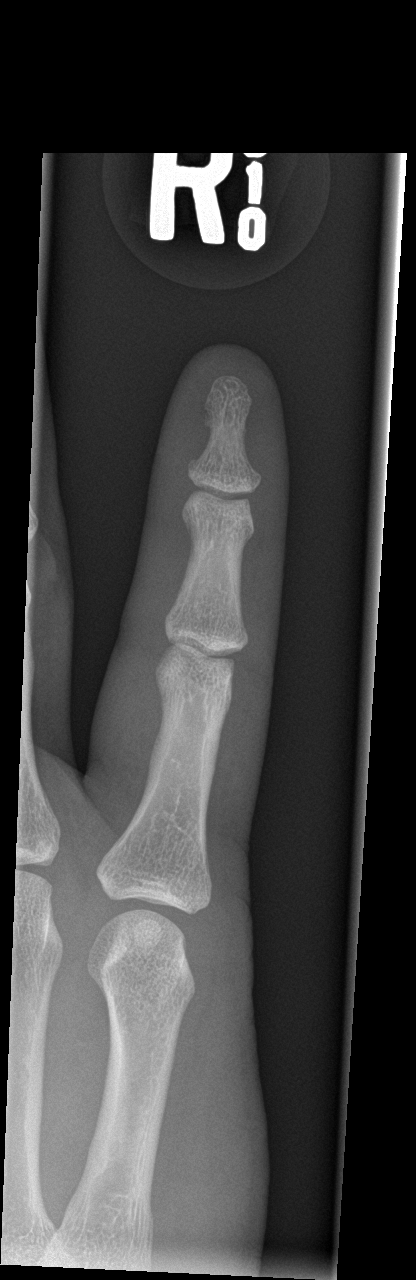
[im 2/3]
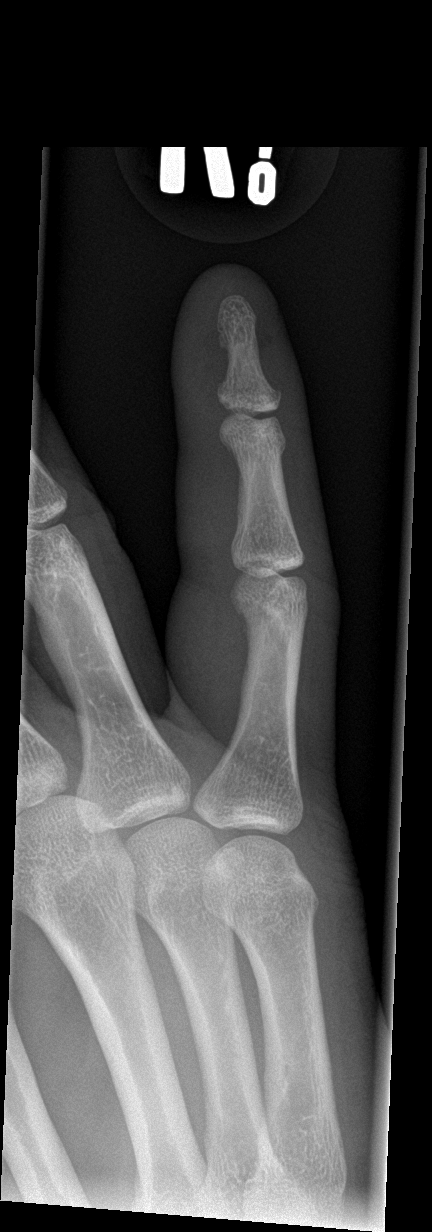
[im 3/3]
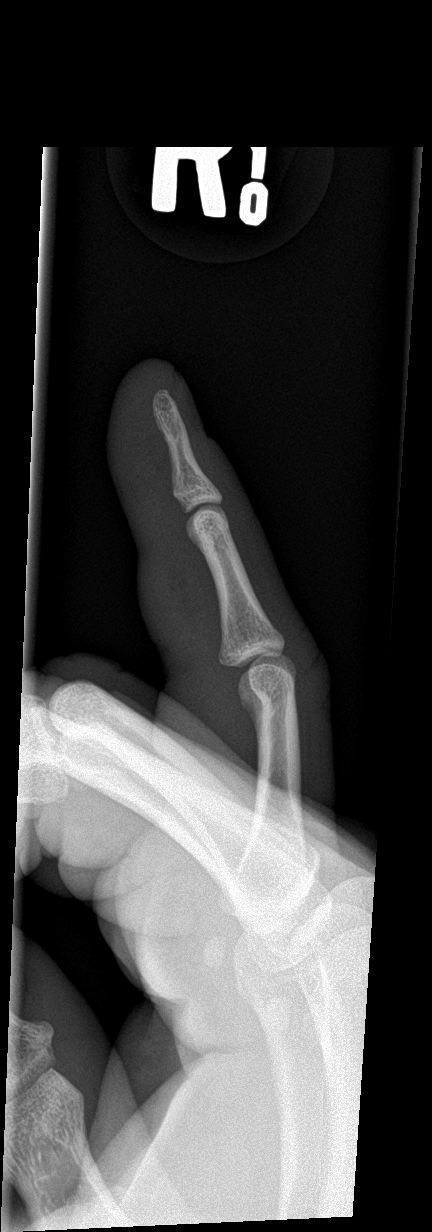

[3 of 3 positions shown; findings below may reference images not displayed]

FINDINGS: No acute bony abnormality. Specifically, no fracture, subluxation,
or dislocation. No radiopaque foreign body. Joint spaces maintained.
IMPRESSION: No acute bony abnormality or radiopaque foreign body.

## 2019-09-07 IMAGING — US US EXTREM UP *R* LTD
1 series · 9 of 9 positions shown · non-contrast
Comparison: None.

CLINICAL DATA: The patient got a splinter in his right little
finger today.

EXAM:
ULTRASOUND RIGHT UPPER EXTREMITY LIMITED
TECHNIQUE: Ultrasound examination of the upper extremity soft tissues was
performed in the area of clinical concern.

[Series 1: us extrem up *right* ltd · 0.05mm/px · 9 of 9 slices shown]
[im 1/9]
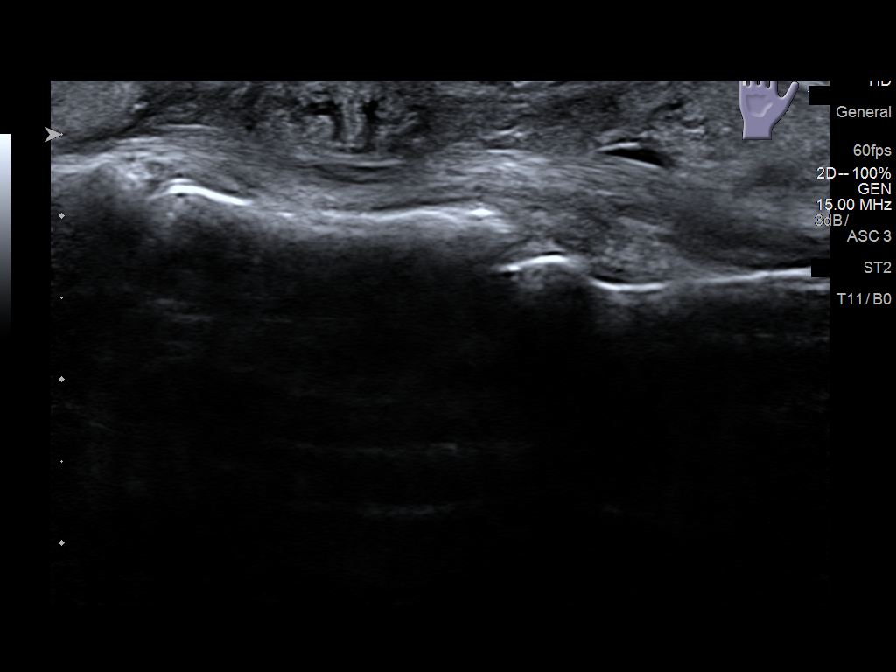
[im 2/9]
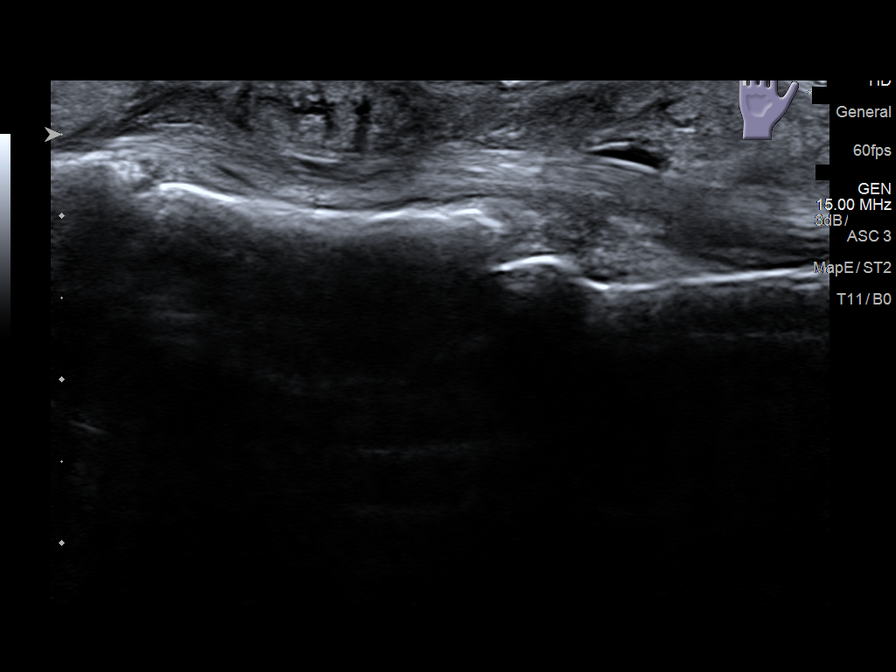
[im 3/9]
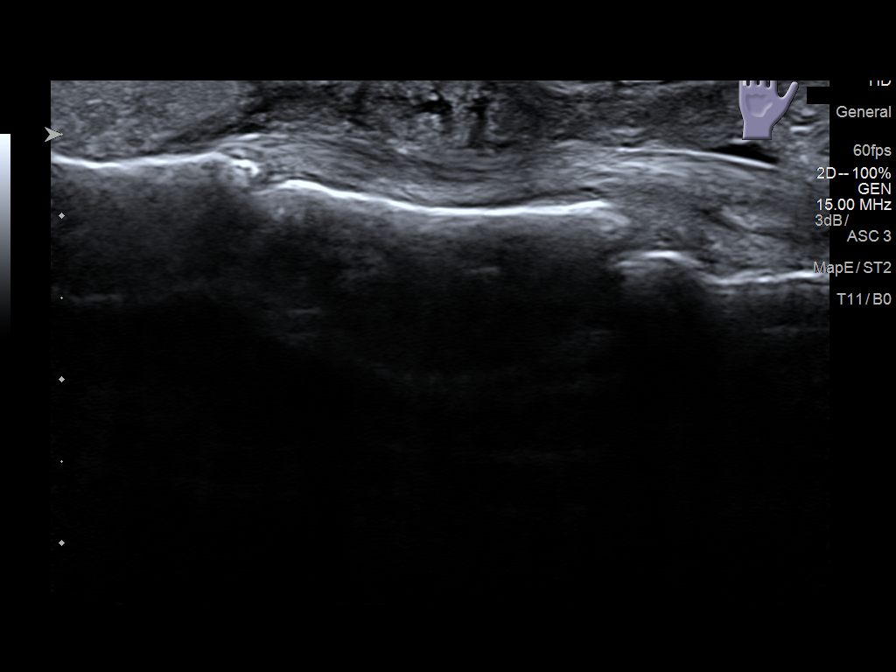
[im 4/9]
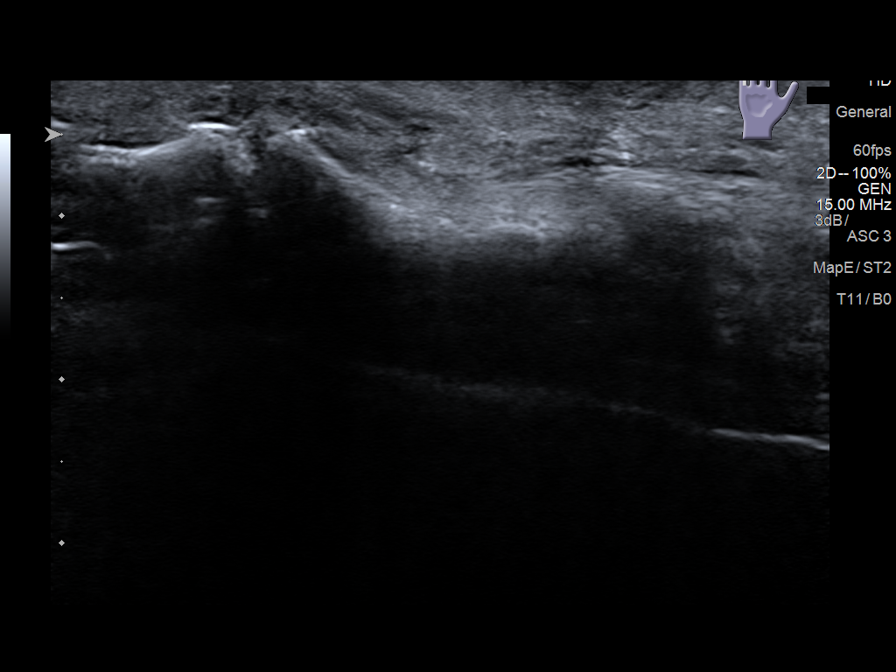
[im 5/9]
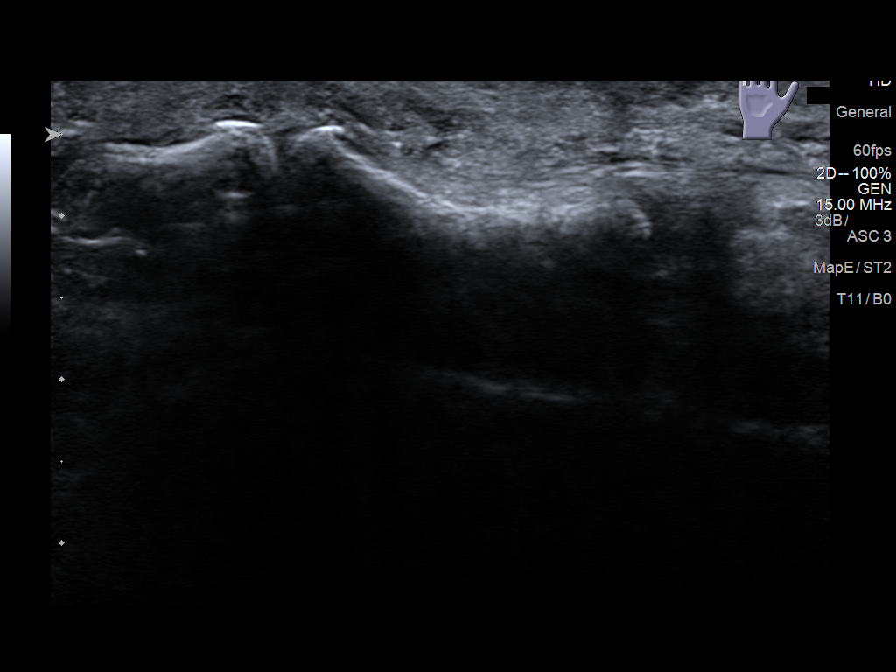
[im 6/9]
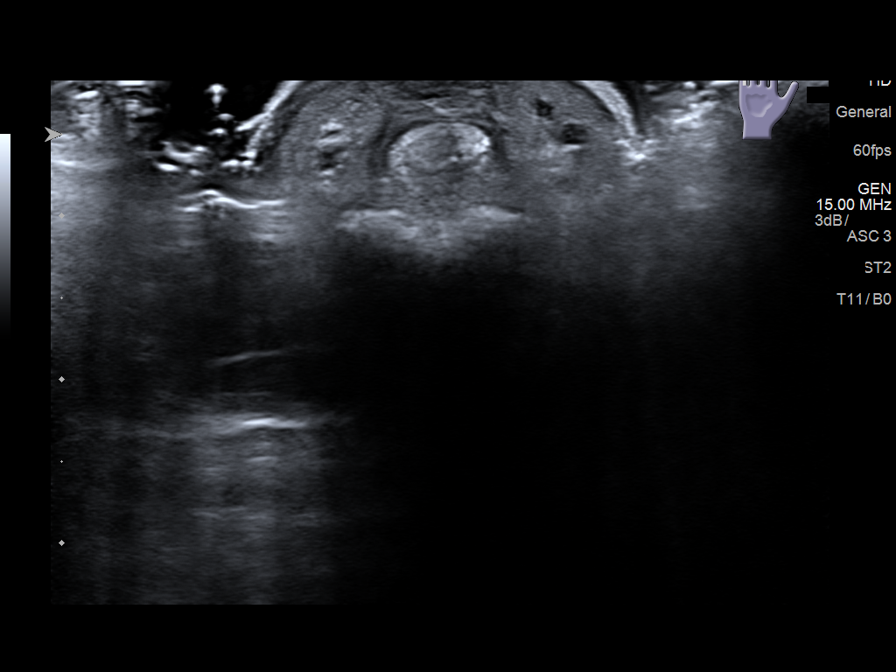
[im 7/9]
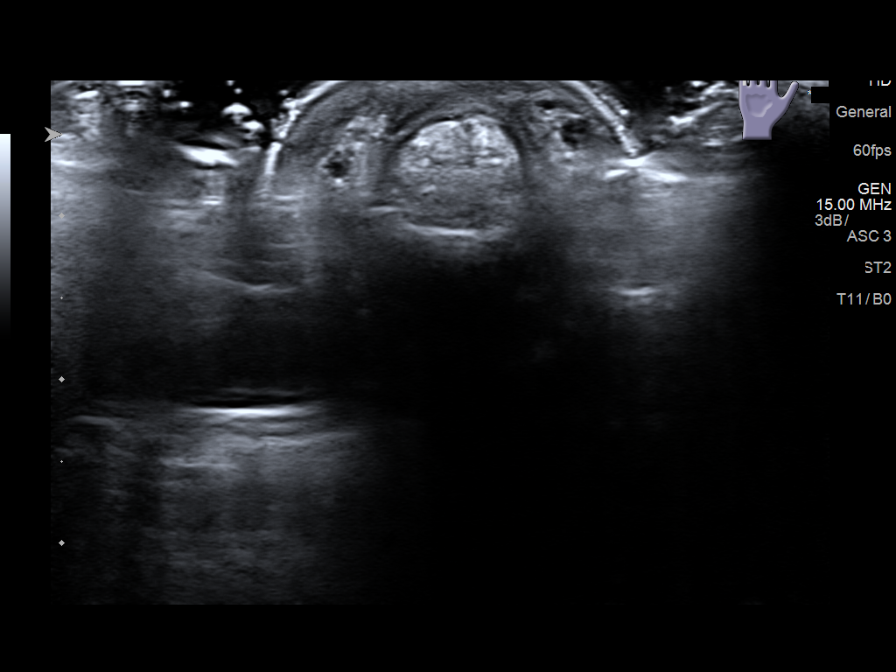
[im 8/9]
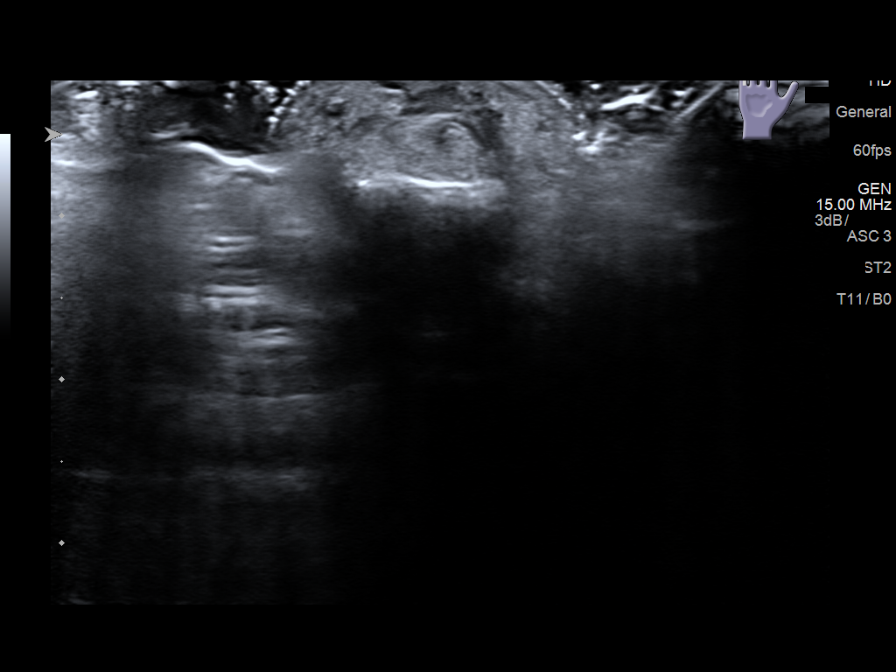
[im 9/9]
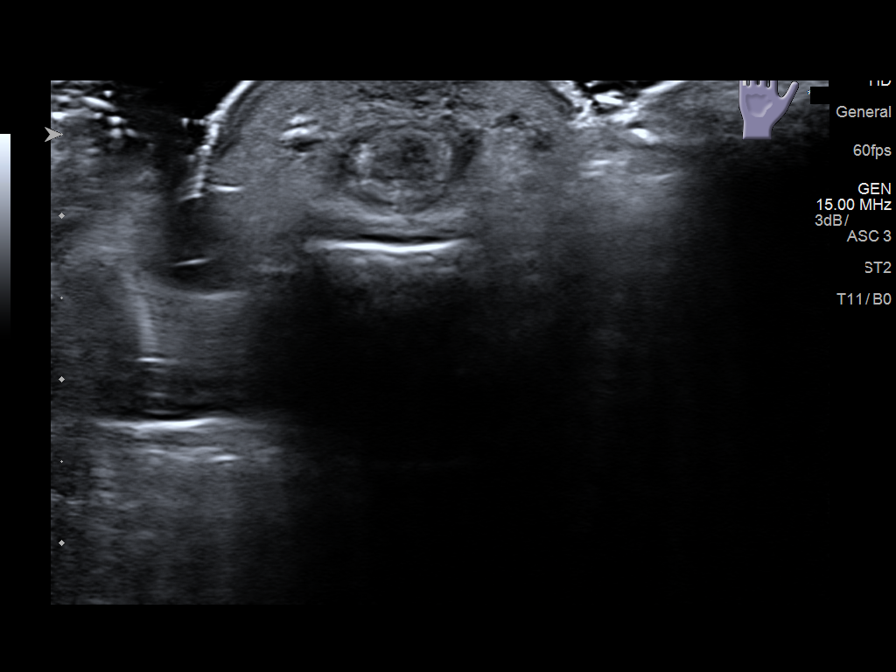

[9 of 9 positions shown; findings below may reference images not displayed]

FINDINGS: No foreign body is seen.  No fluid collection is seen.
IMPRESSION: Negative for foreign body.

## 2019-12-07 IMAGING — DX DG CHEST 2V
2 series · 2 of 2 positions shown · non-contrast
Comparison: 11/12/2015

CLINICAL DATA: Chest pain

EXAM:
CHEST - 2 VIEW

[chest pa]
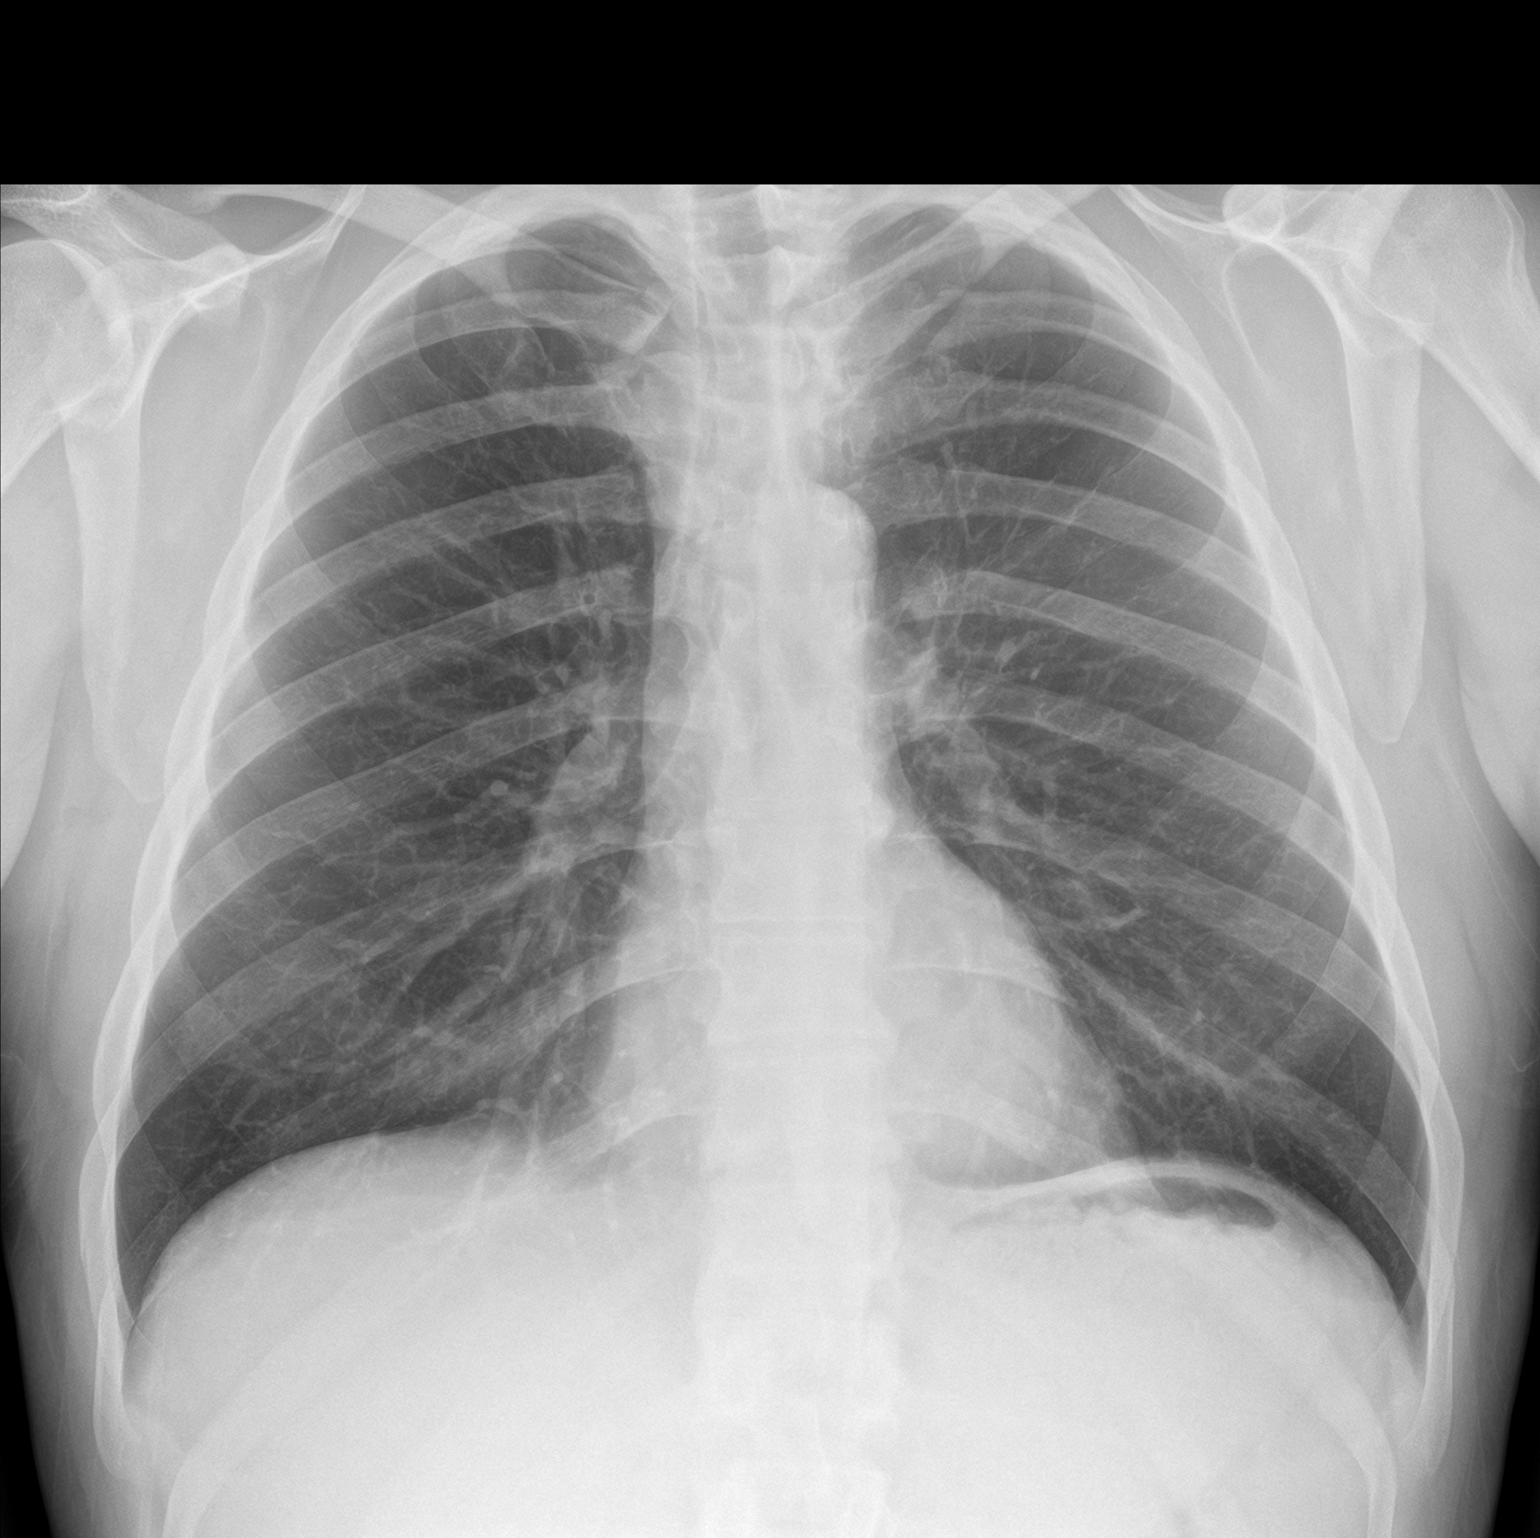

[chest lat]
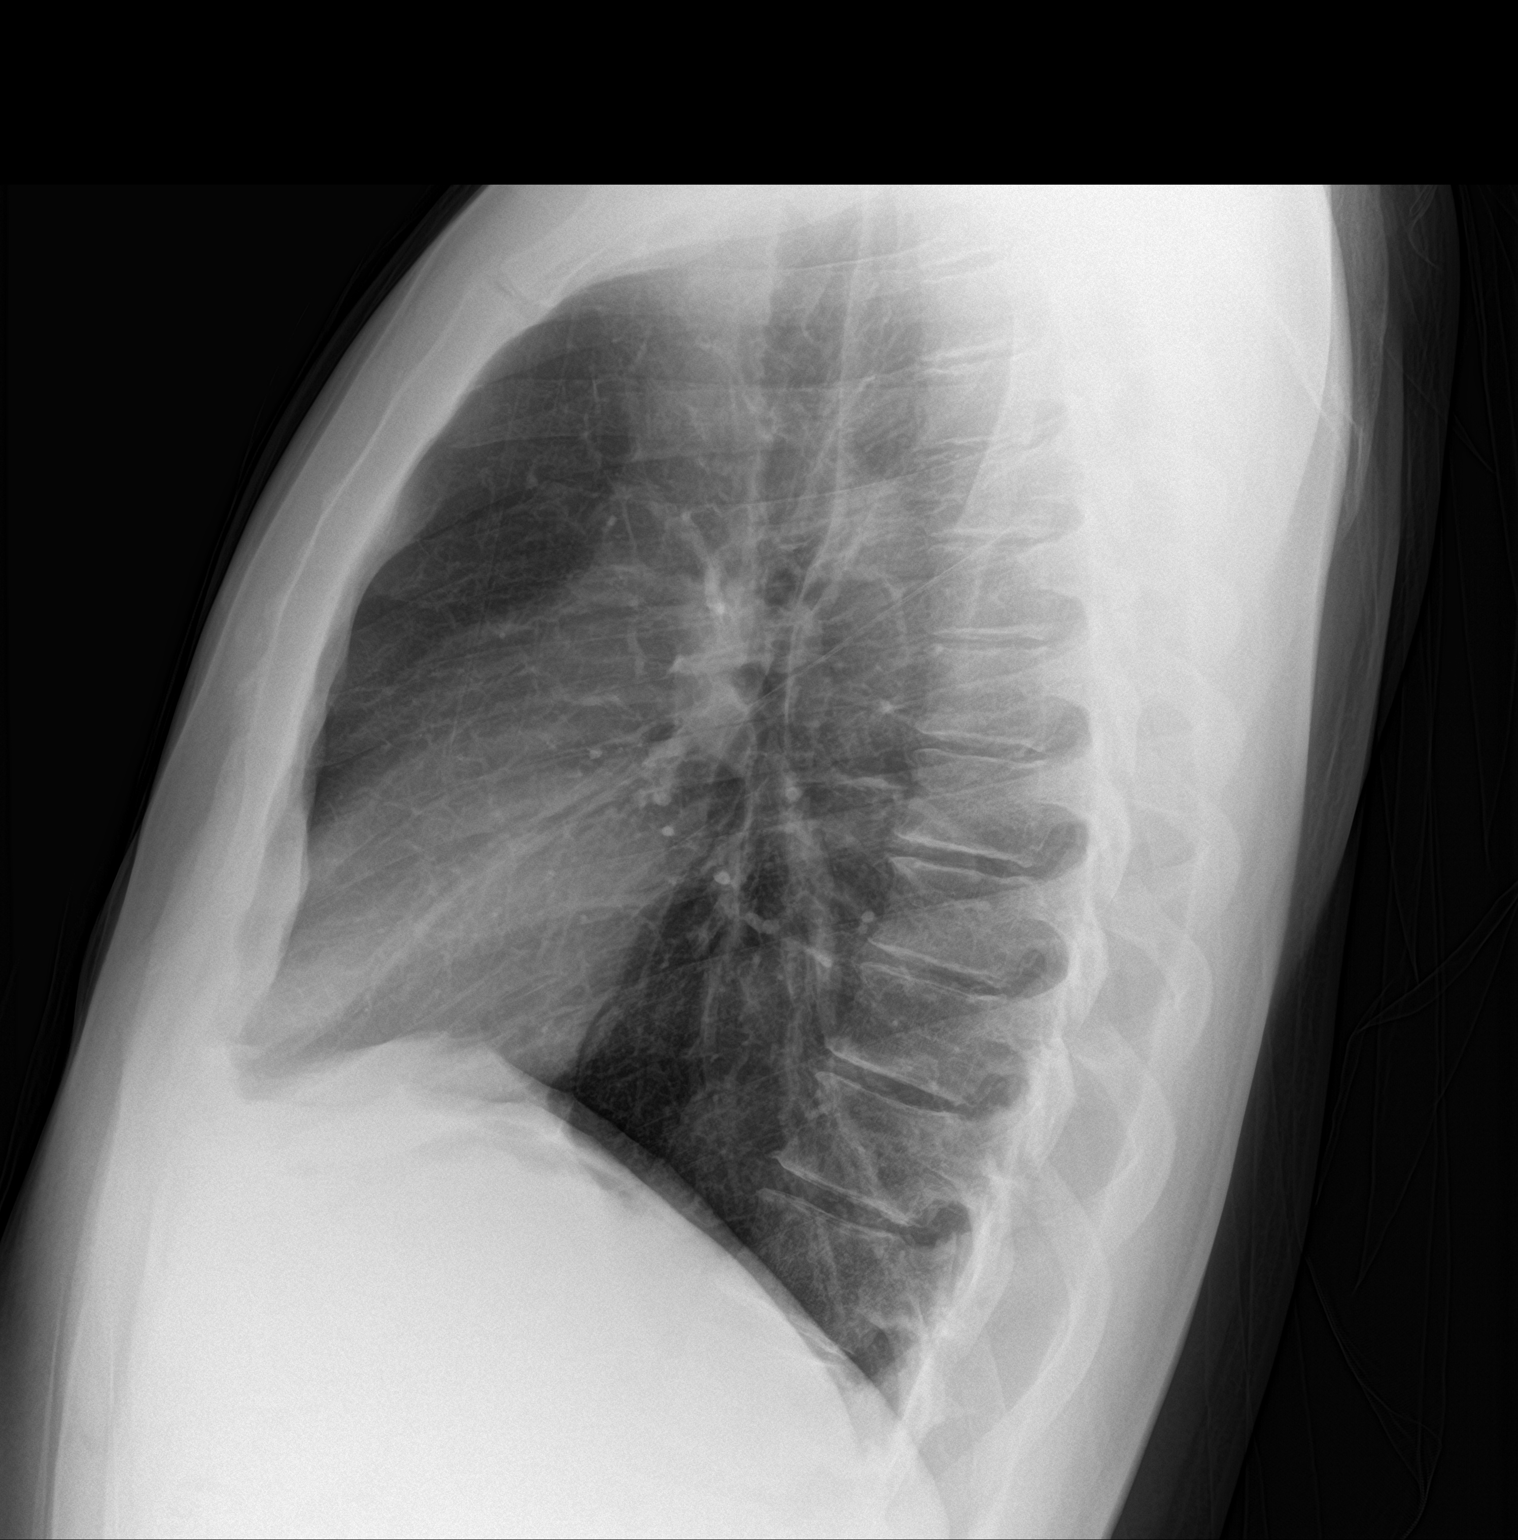

[2 of 2 positions shown; findings below may reference images not displayed]

FINDINGS: The heart size and mediastinal contours are within normal limits.
Both lungs are clear. The visualized skeletal structures are
unremarkable.
IMPRESSION: No active cardiopulmonary disease.

## 2023-09-02 ENCOUNTER — Emergency Department (HOSPITAL_COMMUNITY)

## 2023-09-02 ENCOUNTER — Other Ambulatory Visit: Payer: Self-pay

## 2023-09-02 ENCOUNTER — Emergency Department (HOSPITAL_COMMUNITY)
Admission: EM | Admit: 2023-09-02 | Discharge: 2023-09-03 | Disposition: A | Discharge status: Home / Self Care | Attending: Emergency Medicine | Admitting: Emergency Medicine

## 2023-09-02 ENCOUNTER — Encounter (HOSPITAL_COMMUNITY): Payer: Self-pay | Admitting: Emergency Medicine

## 2023-09-02 DIAGNOSIS — S6392XA Sprain of unspecified part of left wrist and hand, initial encounter: Secondary | ICD-10-CM | POA: Insufficient documentation

## 2023-09-02 DIAGNOSIS — S298XXA Other specified injuries of thorax, initial encounter: Secondary | ICD-10-CM | POA: Diagnosis not present

## 2023-09-02 DIAGNOSIS — W010XXA Fall on same level from slipping, tripping and stumbling without subsequent striking against object, initial encounter: Secondary | ICD-10-CM | POA: Insufficient documentation

## 2023-09-02 DIAGNOSIS — M79642 Pain in left hand: Secondary | ICD-10-CM | POA: Diagnosis present

## 2023-09-02 DIAGNOSIS — W19XXXA Unspecified fall, initial encounter: Secondary | ICD-10-CM

## 2023-09-02 MED ORDER — IBUPROFEN 400 MG PO TABS
400.0000 mg | ORAL_TABLET | Freq: Once | ORAL | Status: AC
  Administered 2023-09-03: 400 mg via ORAL
  Filled 2023-09-02: qty 1

## 2023-09-02 NOTE — ED Triage Notes (Signed)
 Pt fell while getting out of shower this evening. C/o L sided rib pain and L hand pain.

## 2023-09-02 NOTE — ED Provider Notes (Signed)
 [place] EMERGENCY DEPARTMENT AT [facility] Provider Note   CSN: [id number] Arrival date & time: 09/02/23  2332     History  Chief Complaint  Patient presents with   Christian Costa is a 38 y.o. male.  The history is provided by the patient.  Patient presents after accidental fall.  Patient reports he slipped getting out of the shower landing on his left chest.  He reports he has pain in his left ribs and his left hand.  Denies any head injury, no LOC.  No abdominal pain.  Denies any shortness of breath, no hemoptysis He reports he is otherwise healthy at baseline. He does not take any daily medications.     Home Medications Prior to Admission medications   Not on File      Allergies    Other    Review of Systems   Review of Systems  Respiratory:  Negative for shortness of breath.   Gastrointestinal:  Negative for abdominal pain and vomiting.  Musculoskeletal:  Positive for arthralgias. Negative for back pain and neck pain.  Neurological:  Negative for headaches.    Physical Exam Updated Vital Signs BP 126/87   Pulse 77   Temp 97.8 F (36.6 C) (Oral)   Resp 18   Ht 1.88 m (6\' 2" )   Wt 104.3 kg   SpO2 98%   BMI 29.53 kg/m  Physical Exam CONSTITUTIONAL: Well developed/well nourished HEAD: Normocephalic/atraumatic EYES: EOMI/PERRL ENMT: Mucous membranes moist, no facial trauma NECK: supple no meningeal signs SPINE/BACK:entire spine nontender No bruising/crepitance/stepoffs noted to spine CV: S1/S2 noted, no murmurs/rubs/gallops noted LUNGS: Lungs are clear to auscultation bilaterally, no apparent distress Chest-no bruising or crepitus.  Tenderness noted to the left lower costal margin ABDOMEN: soft, nontender, no rebound or guarding, bowel sounds noted throughout abdomen, no bruising, no focal LUQ tenderness GU:no cva tenderness, no bruising NEURO: Pt is awake/alert/appropriate, moves all extremitiesx4.  No facial droop.    EXTREMITIES: pulses normal/equal, full ROM Mild tenderness left hand, no deformities noted All other extremities/joints palpated/ranged and nontender SKIN: warm, color normal PSYCH: no abnormalities of mood noted, alert and oriented to situation  ED Results / Procedures / Treatments   Labs (all labs ordered are listed, but only abnormal results are displayed) Labs Reviewed - No data to display  EKG None  Radiology DG Hand Complete Left Result Date: 09/03/2023 CLINICAL DATA:  fall with pain Pt fell while getting out of shower this evening. C/o L sided rib pain and L hand pain. EXAM: LEFT HAND - COMPLETE 3+ VIEW COMPARISON:  None Available. FINDINGS: There is no evidence of fracture or dislocation. There is no evidence of arthropathy or other focal bone abnormality. Soft tissues are unremarkable. IMPRESSION: Negative. Electronically Signed   By: Morgane  Naveau M.D.   On: 09/03/2023 00:16   DG Ribs Unilateral W/Chest Left Result Date: 09/03/2023 CLINICAL DATA:  Fall and pain fell while getting out of shower this evening. C/o L sided rib pain and L hand pain. EXAM: LEFT RIBS AND CHEST - 3+ VIEW COMPARISON:  Chest x-ray 02/13/2019 FINDINGS: The heart and mediastinal contours are within normal limits. No focal consolidation. No pulmonary edema. No pleural effusion. No pneumothorax. No acute displaced fracture or other bone lesions are seen involving the left ribs. Dermal calcifications along the left chest. IMPRESSION: 1. No acute displaced left rib fracture. Please note, nondisplaced rib fractures may be occult on radiograph. 2. No acute cardiopulmonary abnormality. Electronically  Signed   By: Morgane  Naveau M.D.   On: 09/03/2023 00:15    Procedures Procedures    Medications Ordered in ED Medications  ibuprofen  (ADVIL ) tablet 400 mg (400 mg Oral Given 09/03/23 0009)    ED Course/ Medical Decision Making/ A&P Clinical Course as of 09/03/23 0031  Sat Sep 03, 2023  0030 Patient presents  after accidental fall in the shower.  Reports most of the pain is in his left ribs.  X-ray imaging is negative.  Patient is in no distress, no tachypnea, no hypoxia. Low suspicion for occult rib fracture or pneumothorax.  Low suspicion for acute abdominal traumatic injury Patient safe for discharge [DW]    Clinical Course User Index [DW] Eldon Greenland, MD           Glasgow Coma Scale Score: 15      NEXUS Criteria Score: 0                Medical Decision Making Amount and/or Complexity of Data Reviewed Radiology: ordered.  Risk Prescription drug management.   This patient presents to the ED for concern of fall with chest wall pain, this involves an extensive number of treatment options, and is a complaint that carries with it a high risk of complications and morbidity.  The differential diagnosis includes but is not limited to muscle contusion, rib fracture, pneumothorax, flail chest, hemothorax  Social Determinants of Health: Patient's impaired access to primary care  increases the complexity of managing their presentation  Additional history obtained: Records reviewed  outpatient records reviewed  Imaging Studies ordered: I ordered imaging studies including X-ray left chest, left hand   I independently visualized and interpreted imaging which showed no acute findings I agree with the radiologist interpretation   Medicines ordered and prescription drug management: I ordered medication including ibuprofen  for pain   Reevaluation: After the interventions noted above, I reevaluated the patient and found that they have :improved  Complexity of problems addressed: Patient's presentation is most consistent with  acute presentation with potential threat to life or bodily function  Disposition: After consideration of the diagnostic results and the patient's response to treatment,  I feel that the patent would benefit from discharge   .           Final Clinical  Impression(s) / ED Diagnoses Final diagnoses:  Fall, initial encounter  Blunt trauma to chest, initial encounter  Hand sprain, left, initial encounter    Rx / DC Orders ED Discharge Orders     None         Eldon Greenland, MD 09/03/23 [phone]

## 2023-09-03 NOTE — ED Notes (Signed)
Patient back from XR.
